# Patient Record
Sex: Female | Born: 1935 | Race: White | Hispanic: Yes | Marital: Married
Health system: Southern US, Community
[De-identification: ages and names within clinical notes are randomized; demographics above are authoritative.]

## PROBLEM LIST (undated history)

## (undated) DIAGNOSIS — G309 Alzheimer's disease, unspecified: Secondary | ICD-10-CM

## (undated) DIAGNOSIS — R569 Unspecified convulsions: Secondary | ICD-10-CM

## (undated) DIAGNOSIS — F0280 Dementia in other diseases classified elsewhere without behavioral disturbance: Secondary | ICD-10-CM

## (undated) HISTORY — PX: ABDOMINAL HYSTERECTOMY: SHX81

---

## 2004-06-01 ENCOUNTER — Ambulatory Visit: Payer: Self-pay | Admitting: Internal Medicine

## 2004-07-07 ENCOUNTER — Ambulatory Visit: Payer: Self-pay | Admitting: Internal Medicine

## 2004-07-07 ENCOUNTER — Ambulatory Visit: Payer: Self-pay | Admitting: *Deleted

## 2004-11-17 ENCOUNTER — Ambulatory Visit: Payer: Self-pay | Admitting: Internal Medicine

## 2004-12-01 ENCOUNTER — Ambulatory Visit: Payer: Self-pay | Admitting: Internal Medicine

## 2005-06-01 ENCOUNTER — Ambulatory Visit: Payer: Self-pay | Admitting: Internal Medicine

## 2005-06-15 ENCOUNTER — Ambulatory Visit: Payer: Self-pay | Admitting: Internal Medicine

## 2005-06-28 ENCOUNTER — Ambulatory Visit: Payer: Self-pay | Admitting: Internal Medicine

## 2005-07-20 ENCOUNTER — Ambulatory Visit: Payer: Self-pay | Admitting: Cardiology

## 2005-07-20 ENCOUNTER — Ambulatory Visit (HOSPITAL_COMMUNITY): Admission: RE | Admit: 2005-07-20 | Discharge: 2005-07-20 | Payer: Self-pay | Admitting: Internal Medicine

## 2005-07-20 ENCOUNTER — Encounter: Payer: Self-pay | Admitting: Cardiology

## 2005-08-24 ENCOUNTER — Ambulatory Visit (HOSPITAL_COMMUNITY): Admission: RE | Admit: 2005-08-24 | Discharge: 2005-08-24 | Payer: Self-pay | Admitting: Family Medicine

## 2005-09-14 ENCOUNTER — Ambulatory Visit: Payer: Self-pay | Admitting: Internal Medicine

## 2005-11-09 ENCOUNTER — Ambulatory Visit: Payer: Self-pay | Admitting: Internal Medicine

## 2006-07-20 ENCOUNTER — Ambulatory Visit: Payer: Self-pay | Admitting: Internal Medicine

## 2006-08-02 ENCOUNTER — Ambulatory Visit (HOSPITAL_COMMUNITY): Admission: RE | Admit: 2006-08-02 | Discharge: 2006-08-02 | Payer: Self-pay | Admitting: Internal Medicine

## 2006-08-15 ENCOUNTER — Ambulatory Visit: Payer: Self-pay | Admitting: Internal Medicine

## 2007-04-18 ENCOUNTER — Encounter (INDEPENDENT_AMBULATORY_CARE_PROVIDER_SITE_OTHER): Payer: Self-pay | Admitting: *Deleted

## 2007-06-25 ENCOUNTER — Ambulatory Visit: Payer: Self-pay | Admitting: Internal Medicine

## 2007-06-25 LAB — CONVERTED CEMR LAB
Albumin: 4 g/dL (ref 3.5–5.2)
CO2: 27 meq/L (ref 19–32)
Calcium: 9.3 mg/dL (ref 8.4–10.5)
Glucose, Bld: 140 mg/dL — ABNORMAL HIGH (ref 70–99)
Potassium: 3.8 meq/L (ref 3.5–5.3)
Sodium: 144 meq/L (ref 135–145)
Total Protein: 7.4 g/dL (ref 6.0–8.3)

## 2007-07-04 ENCOUNTER — Emergency Department (HOSPITAL_COMMUNITY): Admission: EM | Admit: 2007-07-04 | Discharge: 2007-07-04 | Payer: Self-pay | Admitting: Emergency Medicine

## 2009-05-13 ENCOUNTER — Ambulatory Visit: Payer: Self-pay | Admitting: Internal Medicine

## 2009-05-13 LAB — CONVERTED CEMR LAB
Basophils Absolute: 0 10*3/uL (ref 0.0–0.1)
Basophils Relative: 0 % (ref 0–1)
Direct LDL: 105 mg/dL — ABNORMAL HIGH
Eosinophils Relative: 1 % (ref 0–5)
HCT: 44.3 % (ref 36.0–46.0)
Helicobacter Pylori Antibody-IgG: <0.4
LDL Cholesterol: 89 mg/dL (ref 0–99)
MCHC: 32.5 g/dL (ref 30.0–36.0)
MCV: 92.9 fL (ref 78.0–100.0)
Monocytes Absolute: 0.4 10*3/uL (ref 0.1–1.0)
Monocytes Relative: 6 % (ref 3–12)
RDW: 13 % (ref 11.5–15.5)
Total CHOL/HDL Ratio: 3.9
VLDL: 43 mg/dL — ABNORMAL HIGH (ref 0–40)
WBC: 7.2 10*3/uL (ref 4.0–10.5)

## 2009-09-04 ENCOUNTER — Ambulatory Visit: Payer: Self-pay | Admitting: Family Medicine

## 2009-12-23 ENCOUNTER — Ambulatory Visit: Payer: Self-pay | Admitting: Internal Medicine

## 2010-08-22 ENCOUNTER — Encounter: Payer: Self-pay | Admitting: Internal Medicine

## 2018-03-21 ENCOUNTER — Emergency Department (HOSPITAL_COMMUNITY)
Admission: EM | Admit: 2018-03-21 | Discharge: 2018-03-21 | Disposition: A | Discharge status: Home / Self Care | Payer: Medicare Other | Attending: Emergency Medicine | Admitting: Emergency Medicine

## 2018-03-21 ENCOUNTER — Emergency Department (HOSPITAL_COMMUNITY): Payer: Medicare Other

## 2018-03-21 ENCOUNTER — Other Ambulatory Visit: Payer: Self-pay

## 2018-03-21 ENCOUNTER — Encounter (HOSPITAL_COMMUNITY): Payer: Self-pay | Admitting: Emergency Medicine

## 2018-03-21 DIAGNOSIS — F0280 Dementia in other diseases classified elsewhere without behavioral disturbance: Secondary | ICD-10-CM | POA: Diagnosis not present

## 2018-03-21 DIAGNOSIS — Z79899 Other long term (current) drug therapy: Secondary | ICD-10-CM | POA: Insufficient documentation

## 2018-03-21 DIAGNOSIS — R140 Abdominal distension (gaseous): Secondary | ICD-10-CM

## 2018-03-21 DIAGNOSIS — G308 Other Alzheimer's disease: Secondary | ICD-10-CM | POA: Diagnosis not present

## 2018-03-21 HISTORY — DX: Alzheimer's disease, unspecified: G30.9

## 2018-03-21 HISTORY — DX: Unspecified convulsions: R56.9

## 2018-03-21 HISTORY — DX: Dementia in other diseases classified elsewhere, unspecified severity, without behavioral disturbance, psychotic disturbance, mood disturbance, and anxiety: F02.80

## 2018-03-21 MED ORDER — SIMETHICONE 80 MG PO CHEW: 80.0000 mg | CHEWABLE_TABLET | Freq: Four times a day (QID) | ORAL | 0 refills | Status: AC | PRN

## 2018-03-21 NOTE — ED Triage Notes (Addendum)
Patient BIB family, reports patient has not had a BM in 10 days. Reports using enema last night with no relief. Hx dementia. Nonverbal. Family denies vomiting.

## 2018-03-21 NOTE — ED Provider Notes (Signed)
[facility]-EMERGENCY DEPT Provider Note   CSN: [id number] Arrival date & time: 03/21/18  1800     History   Chief Complaint Chief Complaint  Patient presents with  . Constipation    HPI April Lucero is a 84 y.o. female.  HPI Patient with history of dementia.  She is nonverbal at baseline.  Level 5 caveat.  Per family patient has had 10 days of constipation.  Has been given 2 doses of MiraLAX and an enema yesterday.  No vomiting.  Patient's been eating well. Past Medical History:  Diagnosis Date  . Alzheimer disease   . Seizures (HCC)     There are no active problems to display for this patient.   Past Surgical History:  Procedure Laterality Date  . ABDOMINAL HYSTERECTOMY       OB History   None      Home Medications    Prior to Admission medications   Medication Sig Start Date End Date Taking? Authorizing Provider  donepezil (ARICEPT) 5 MG tablet Take 5 mg by mouth at bedtime. 01/17/18   [provider]  memantine ([name] XR) 28 MG CP24 24 hr capsule Take 28 mg by mouth daily. 12/29/17   [provider]  QUEtiapine (SEROQUEL) 100 MG tablet Take 100 mg by mouth at bedtime. 01/08/18   [provider]  simethicone (GAS-X) 80 MG chewable tablet Chew 1 tablet (80 mg total) by mouth every 6 (six) hours as needed for flatulence. 03/21/18   Loren RacerYelverton, Mixtli Reno, MD    Family History No family history on file.  Social History Social History   Tobacco Use  . Smoking status: Not on file  Substance Use Topics  . Alcohol use: Not on file  . Drug use: Not on file     Allergies   Patient has no known allergies.   Review of Systems Review of Systems  Unable to perform ROS: Dementia     Physical Exam Updated Vital Signs BP (!) 158/99 (BP Location: Left Arm)   Pulse 95   Temp 97.6 F (36.4 C) (Axillary)   Resp 18   SpO2 95%   Physical Exam  Constitutional: She appears well-developed and well-nourished. No  distress.  HENT:  Head: Normocephalic and atraumatic.  Mouth/Throat: Oropharynx is clear and moist.  Eyes: Pupils are equal, round, and reactive to light. EOM are normal.  Neck: Normal range of motion. Neck supple.  Cardiovascular: Normal rate and regular rhythm.  Pulmonary/Chest: Effort normal and breath sounds normal.  Abdominal: Soft. Bowel sounds are normal. She exhibits distension. There is no tenderness. There is no rebound and no guarding.  Mild abdominal distention.  Musculoskeletal: Normal range of motion. She exhibits no edema or tenderness.  Neurological: She is alert.  Moving all extremities without deficit.  Skin: Skin is warm and dry. Capillary refill takes less than 2 seconds. No rash noted. She is not diaphoretic. No erythema.  Nursing note and vitals reviewed.    ED Treatments / Results  Labs (all labs ordered are listed, but only abnormal results are displayed) Labs Reviewed - No data to display  EKG None  Radiology Dg Abd Acute W/chest  Result Date: 03/21/2018 CLINICAL DATA:  Constipation, no bowel movement for 10 days EXAM: DG ABDOMEN ACUTE W/ 1V CHEST COMPARISON:  None FINDINGS: Enlargement of cardiac silhouette. Pulmonary vascularity normal. Atherosclerotic calcification aorta. Probable hiatal hernia. Lungs appear clear. Question nipple shadows versus nodular densities at the lung bases. Bones demineralized. Gaseous distention  of stomach. Bowel gas pattern otherwise normal. Scattered stool throughout colon, with normal retained stool burden. No bowel dilatation, bowel wall thickening, or free air. No urinary tract calcification. IMPRESSION: Normal bowel gas pattern. Question nipple shadows; repeat chest radiograph with nipple markers recommended to exclude pulmonary nodule. Electronically Signed   By: Ulyses SouthwardMark  Boles M.D.   On: 03/21/2018 20:47    Procedures Procedures (including critical care time)  Medications Ordered in ED Medications - No data to  display   Initial Impression / Assessment and Plan / ED Course  I have reviewed the triage vital signs and the nursing notes.  Pertinent labs & imaging results that were available during my care of the patient were reviewed by me and considered in my medical decision making (see chart for details).     Abdominal exam is benign.  Abdominal x-rays without evidence of constipation.  Gas throughout.  Discussed with family.  Will follow-up with primary physician.  Return precautions given.  Final Clinical Impressions(s) / ED Diagnoses   Final diagnoses:  Gaseous abdominal distention    ED Discharge Orders         Ordered    simethicone (GAS-X) 80 MG chewable tablet  Every 6 hours PRN     03/21/18 2130           Loren RacerYelverton, Ed Mandich, MD 03/21/18 2131

## 2019-07-10 ENCOUNTER — Emergency Department (HOSPITAL_COMMUNITY): Payer: Medicare Other

## 2019-07-10 ENCOUNTER — Inpatient Hospital Stay (HOSPITAL_COMMUNITY)
Admission: EM | Admit: 2019-07-10 | Discharge: 2019-07-17 | DRG: 177 | Disposition: A | Discharge status: Hospice | Payer: Medicare Other | Attending: Internal Medicine | Admitting: Internal Medicine

## 2019-07-10 ENCOUNTER — Other Ambulatory Visit: Payer: Self-pay

## 2019-07-10 DIAGNOSIS — R569 Unspecified convulsions: Secondary | ICD-10-CM

## 2019-07-10 DIAGNOSIS — Z66 Do not resuscitate: Secondary | ICD-10-CM | POA: Diagnosis not present

## 2019-07-10 DIAGNOSIS — R739 Hyperglycemia, unspecified: Secondary | ICD-10-CM | POA: Diagnosis not present

## 2019-07-10 DIAGNOSIS — R636 Underweight: Secondary | ICD-10-CM | POA: Diagnosis present

## 2019-07-10 DIAGNOSIS — T380X5A Adverse effect of glucocorticoids and synthetic analogues, initial encounter: Secondary | ICD-10-CM | POA: Diagnosis not present

## 2019-07-10 DIAGNOSIS — I7789 Other specified disorders of arteries and arterioles: Secondary | ICD-10-CM | POA: Diagnosis present

## 2019-07-10 DIAGNOSIS — Z90710 Acquired absence of both cervix and uterus: Secondary | ICD-10-CM

## 2019-07-10 DIAGNOSIS — G9341 Metabolic encephalopathy: Secondary | ICD-10-CM | POA: Diagnosis present

## 2019-07-10 DIAGNOSIS — E870 Hyperosmolality and hypernatremia: Secondary | ICD-10-CM | POA: Diagnosis present

## 2019-07-10 DIAGNOSIS — J9601 Acute respiratory failure with hypoxia: Secondary | ICD-10-CM | POA: Diagnosis present

## 2019-07-10 DIAGNOSIS — J1289 Other viral pneumonia: Secondary | ICD-10-CM | POA: Diagnosis present

## 2019-07-10 DIAGNOSIS — J1282 Pneumonia due to coronavirus disease 2019: Secondary | ICD-10-CM | POA: Diagnosis present

## 2019-07-10 DIAGNOSIS — Z79899 Other long term (current) drug therapy: Secondary | ICD-10-CM | POA: Diagnosis not present

## 2019-07-10 DIAGNOSIS — U071 COVID-19: Secondary | ICD-10-CM | POA: Diagnosis present

## 2019-07-10 DIAGNOSIS — E876 Hypokalemia: Secondary | ICD-10-CM | POA: Diagnosis present

## 2019-07-10 DIAGNOSIS — R221 Localized swelling, mass and lump, neck: Secondary | ICD-10-CM | POA: Diagnosis not present

## 2019-07-10 DIAGNOSIS — G309 Alzheimer's disease, unspecified: Secondary | ICD-10-CM | POA: Diagnosis present

## 2019-07-10 DIAGNOSIS — Z515 Encounter for palliative care: Secondary | ICD-10-CM | POA: Diagnosis not present

## 2019-07-10 DIAGNOSIS — G40909 Epilepsy, unspecified, not intractable, without status epilepticus: Secondary | ICD-10-CM | POA: Diagnosis present

## 2019-07-10 DIAGNOSIS — Z681 Body mass index (BMI) 19 or less, adult: Secondary | ICD-10-CM | POA: Diagnosis not present

## 2019-07-10 DIAGNOSIS — F0280 Dementia in other diseases classified elsewhere without behavioral disturbance: Secondary | ICD-10-CM | POA: Diagnosis present

## 2019-07-10 DIAGNOSIS — R0902 Hypoxemia: Secondary | ICD-10-CM

## 2019-07-10 LAB — COMPREHENSIVE METABOLIC PANEL
ALT: 14 U/L (ref 0–44)
AST: 29 U/L (ref 15–41)
Albumin: 3.1 g/dL — ABNORMAL LOW (ref 3.5–5.0)
Alkaline Phosphatase: 28 U/L — ABNORMAL LOW (ref 38–126)
Anion gap: 9 (ref 5–15)
BUN: 18 mg/dL (ref 8–23)
CO2: 31 mmol/L (ref 22–32)
Calcium: 8.9 mg/dL (ref 8.9–10.3)
Chloride: 104 mmol/L (ref 98–111)
Creatinine, Ser: 0.64 mg/dL (ref 0.44–1.00)
GFR calc Af Amer: >60 mL/min (ref >60)
GFR calc non Af Amer: >60 mL/min (ref >60)
Glucose, Bld: 89 mg/dL (ref 70–99)
Potassium: 4 mmol/L (ref 3.5–5.1)
Sodium: 144 mmol/L (ref 135–145)
Total Bilirubin: 1.3 mg/dL — ABNORMAL HIGH (ref 0.3–1.2)
Total Protein: 6.9 g/dL (ref 6.5–8.1)

## 2019-07-10 LAB — LACTIC ACID, PLASMA
Lactic Acid, Venous: 1.8 mmol/L (ref 0.5–1.9)
Lactic Acid, Venous: 1.7 mmol/L (ref 0.5–1.9)

## 2019-07-10 LAB — CBC WITH DIFFERENTIAL/PLATELET
Abs Immature Granulocytes: 0.02 10*3/uL (ref 0.00–0.07)
Basophils Absolute: 0 10*3/uL (ref 0.0–0.1)
Basophils Relative: 0 %
Eosinophils Absolute: 0 10*3/uL (ref 0.0–0.5)
Eosinophils Relative: 0 %
HCT: 40.5 % (ref 36.0–46.0)
Hemoglobin: 12.9 g/dL (ref 12.0–15.0)
Immature Granulocytes: 1 %
Lymphocytes Relative: 11 %
Lymphs Abs: 0.5 10*3/uL — ABNORMAL LOW (ref 0.7–4.0)
MCH: 31.7 pg (ref 26.0–34.0)
MCHC: 31.9 g/dL (ref 30.0–36.0)
MCV: 99.5 fL (ref 80.0–100.0)
Monocytes Absolute: 0.1 10*3/uL (ref 0.1–1.0)
Monocytes Relative: 3 %
Neutro Abs: 3.6 10*3/uL (ref 1.7–7.7)
Neutrophils Relative %: 85 %
Platelets: 110 10*3/uL — ABNORMAL LOW (ref 150–400)
RBC: 4.07 MIL/uL (ref 3.87–5.11)
RDW: 13.2 % (ref 11.5–15.5)
WBC: 4.2 10*3/uL (ref 4.0–10.5)
nRBC: 0 % (ref 0.0–0.2)

## 2019-07-10 LAB — POC SARS CORONAVIRUS 2 AG -  ED: SARS Coronavirus 2 Ag: POSITIVE — AB

## 2019-07-10 LAB — URINALYSIS, ROUTINE W REFLEX MICROSCOPIC
Bilirubin Urine: NEGATIVE
Glucose, UA: NEGATIVE mg/dL
Hgb urine dipstick: NEGATIVE
Ketones, ur: NEGATIVE mg/dL
Leukocytes,Ua: NEGATIVE
Nitrite: NEGATIVE
Protein, ur: 100 mg/dL — AB
Specific Gravity, Urine: 1.015 (ref 1.005–1.030)
pH: 6 (ref 5.0–8.0)

## 2019-07-10 LAB — PROTIME-INR
INR: 1 (ref 0.8–1.2)
Prothrombin Time: 13.5 seconds (ref 11.4–15.2)

## 2019-07-10 LAB — D-DIMER, QUANTITATIVE: D-Dimer, Quant: 3.38 ug/mL-FEU — ABNORMAL HIGH (ref 0.00–0.50)

## 2019-07-10 LAB — APTT: aPTT: 31 seconds (ref 24–36)

## 2019-07-10 LAB — ABO/RH: ABO/RH(D): O POS

## 2019-07-10 LAB — BRAIN NATRIURETIC PEPTIDE: B Natriuretic Peptide: 116 pg/mL — ABNORMAL HIGH (ref 0.0–100.0)

## 2019-07-10 LAB — FERRITIN: Ferritin: 204 ng/mL (ref 11–307)

## 2019-07-10 LAB — TSH: TSH: 0.853 u[IU]/mL (ref 0.350–4.500)

## 2019-07-10 LAB — HEPATITIS B SURFACE ANTIGEN: Hepatitis B Surface Ag: NONREACTIVE

## 2019-07-10 LAB — C-REACTIVE PROTEIN: CRP: 29 mg/dL — ABNORMAL HIGH (ref <1.0)

## 2019-07-10 LAB — TYPE AND SCREEN
ABO/RH(D): O POS
Antibody Screen: NEGATIVE

## 2019-07-10 LAB — TROPONIN I (HIGH SENSITIVITY): Troponin I (High Sensitivity): 11 ng/L (ref <18)

## 2019-07-10 MED ORDER — DONEPEZIL HCL 10 MG PO TABS
5.0000 mg | ORAL_TABLET | Freq: Every day | ORAL | Status: DC
  Administered 2019-07-11: 5 mg via ORAL
  Filled 2019-07-10 (×2): qty 1

## 2019-07-10 MED ORDER — ACETAMINOPHEN 650 MG RE SUPP
650.0000 mg | Freq: Once | RECTAL | Status: AC
  Administered 2019-07-10: 650 mg via RECTAL

## 2019-07-10 MED ORDER — QUETIAPINE FUMARATE 25 MG PO TABS
100.0000 mg | ORAL_TABLET | Freq: Every day | ORAL | Status: DC
  Administered 2019-07-11: 100 mg via ORAL
  Filled 2019-07-10: qty 1
  Filled 2019-07-10: qty 4

## 2019-07-10 MED ORDER — SENNOSIDES-DOCUSATE SODIUM 8.6-50 MG PO TABS: 1.0000 | ORAL_TABLET | Freq: Every evening | ORAL | Status: DC | PRN

## 2019-07-10 MED ORDER — ENOXAPARIN SODIUM 40 MG/0.4ML ~~LOC~~ SOLN
40.0000 mg | SUBCUTANEOUS | Status: DC
  Administered 2019-07-11: 40 mg via SUBCUTANEOUS
  Filled 2019-07-10: qty 0.4

## 2019-07-10 MED ORDER — ACETAMINOPHEN 650 MG RE SUPP
RECTAL | Status: AC
  Administered 2019-07-10: 650 mg
  Filled 2019-07-10: qty 1

## 2019-07-10 MED ORDER — SODIUM CHLORIDE 0.9 % IV SOLN
100.0000 mg | Freq: Every day | INTRAVENOUS | Status: AC
  Administered 2019-07-11 – 2019-07-14 (×4): 100 mg via INTRAVENOUS
  Filled 2019-07-10 (×4): qty 20

## 2019-07-10 MED ORDER — ZINC SULFATE 220 (50 ZN) MG PO CAPS
220.0000 mg | ORAL_CAPSULE | Freq: Every day | ORAL | Status: DC
  Filled 2019-07-10 (×4): qty 1

## 2019-07-10 MED ORDER — ACETAMINOPHEN 325 MG PO TABS
650.0000 mg | ORAL_TABLET | Freq: Four times a day (QID) | ORAL | Status: DC | PRN
  Filled 2019-07-10: qty 2

## 2019-07-10 MED ORDER — HYDROCOD POLST-CPM POLST ER 10-8 MG/5ML PO SUER: 5.0000 mL | Freq: Two times a day (BID) | ORAL | Status: DC | PRN

## 2019-07-10 MED ORDER — SODIUM CHLORIDE 0.9 % IV SOLN
200.0000 mg | Freq: Once | INTRAVENOUS | Status: AC
  Administered 2019-07-10: 200 mg via INTRAVENOUS
  Filled 2019-07-10: qty 200

## 2019-07-10 MED ORDER — VITAMIN C 500 MG PO TABS
500.0000 mg | ORAL_TABLET | Freq: Every day | ORAL | Status: DC
  Filled 2019-07-10 (×4): qty 1

## 2019-07-10 MED ORDER — IPRATROPIUM-ALBUTEROL 20-100 MCG/ACT IN AERS
1.0000 | INHALATION_SPRAY | Freq: Four times a day (QID) | RESPIRATORY_TRACT | Status: DC
  Administered 2019-07-11 – 2019-07-17 (×23): 1 via RESPIRATORY_TRACT
  Filled 2019-07-10 (×2): qty 4

## 2019-07-10 MED ORDER — MEMANTINE HCL ER 28 MG PO CP24
28.0000 mg | ORAL_CAPSULE | Freq: Every day | ORAL | Status: DC
  Filled 2019-07-10 (×8): qty 1

## 2019-07-10 MED ORDER — ONDANSETRON HCL 4 MG/2ML IJ SOLN: 4.0000 mg | Freq: Four times a day (QID) | INTRAMUSCULAR | Status: DC | PRN

## 2019-07-10 MED ORDER — LEVOFLOXACIN IN D5W 500 MG/100ML IV SOLN
500.0000 mg | Freq: Once | INTRAVENOUS | Status: AC
  Administered 2019-07-10: 16:00:00 500 mg via INTRAVENOUS
  Filled 2019-07-10: qty 100

## 2019-07-10 MED ORDER — DEXAMETHASONE SODIUM PHOSPHATE 10 MG/ML IJ SOLN
6.0000 mg | INTRAMUSCULAR | Status: DC
  Administered 2019-07-11: 6 mg via INTRAVENOUS
  Filled 2019-07-10: qty 1

## 2019-07-10 MED ORDER — GUAIFENESIN-DM 100-10 MG/5ML PO SYRP: 10.0000 mL | ORAL_SOLUTION | ORAL | Status: DC | PRN

## 2019-07-10 MED ORDER — LEVETIRACETAM 500 MG PO TABS
500.0000 mg | ORAL_TABLET | Freq: Two times a day (BID) | ORAL | Status: DC
  Administered 2019-07-11: 500 mg via ORAL
  Filled 2019-07-10 (×2): qty 1

## 2019-07-10 MED ORDER — SIMETHICONE 80 MG PO CHEW
80.0000 mg | CHEWABLE_TABLET | Freq: Four times a day (QID) | ORAL | Status: DC | PRN
  Filled 2019-07-10: qty 1

## 2019-07-10 MED ORDER — SODIUM CHLORIDE 0.9 % IV BOLUS
1000.0000 mL | Freq: Once | INTRAVENOUS | Status: AC
  Administered 2019-07-10: 1000 mL via INTRAVENOUS

## 2019-07-10 MED ORDER — ONDANSETRON HCL 4 MG PO TABS: 4.0000 mg | ORAL_TABLET | Freq: Four times a day (QID) | ORAL | Status: DC | PRN

## 2019-07-10 NOTE — ED Notes (Addendum)
ED TO INPATIENT HANDOFF REPORT  ED Nurse Name and Phone #:  Anderson Malta [phone] S Name/Age/Gender April Lucero 85 y.o. female Room/Bed: WA18/WA18  Code Status   Code Status: Full Code  Home/SNF/Other HOME Patient oriented to: self Is this baseline? Yes   Triage Complete: Triage complete  Chief Complaint possible Covid, congestion, cough, fever  Triage Note Arrives via EMS from home, C/C fever, and congestion that started last night per family. EMS temp 101.8. Rhonchi and tachypnea noted. Original room air sats at mid 23s, EMS applied a  non-rebreather and got saturation in the 90s.    Allergies No Known Allergies  Level of Care/Admitting Diagnosis ED Disposition    ED Disposition Condition [facility] Area: [place] [[id number]]  Level of Care: Med-Surg [16]  Covid Evaluation: Confirmed COVID Positive  Diagnosis: Pneumonia due to COVID-19 virus [[id number]]  Admitting Physician: Alma Friendly [[id number]]  Attending Physician: Alma Friendly [[id number]]  Estimated length of stay: past midnight tomorrow  Certification:: I certify this patient will need inpatient services for at least 2 midnights  PT Class (Do Not Modify): Inpatient [101]  PT Acc Code (Do Not Modify): Private [1]       B Medical/Surgery History Past Medical History:  Diagnosis Date  . Alzheimer disease   . Seizures ([place])    Past Surgical History:  Procedure Laterality Date  . ABDOMINAL HYSTERECTOMY       A IV Location/Drains/Wounds Patient Lines/Drains/Airways Status   Active Line/Drains/Airways    Name:   Placement date:   Placement time:   Site:   Days:   Peripheral IV 07/10/19 Left;Upper Forearm   07/10/19    1549    Forearm   less than 1          Intake/Output Last 24 hours  Intake/Output Summary (Last 24 hours) at 07/10/2019 2119 Last data filed at 07/10/2019 1948 Gross per 24 hour  Intake 1345.6 ml  Output -  Net 1345.6 ml     Labs/Imaging Results for orders placed or performed during the hospital encounter of 07/10/19 (from the past 48 hour(s))  Lactic acid, plasma     Status: None   Collection Time: 07/10/19  3:48 PM  Result Value Ref Range   Lactic Acid, Venous 1.8 0.5 - 1.9 mmol/L    Comment: Performed at [facility], [place] [address], [place], [place] [address]  Comprehensive metabolic panel     Status: Abnormal   Collection Time: 07/10/19  3:48 PM  Result Value Ref Range   Sodium 144 135 - 145 mmol/L   Potassium 4.0 3.5 - 5.1 mmol/L   Chloride 104 98 - 111 mmol/L   CO2 31 22 - 32 mmol/L   Glucose, Bld 89 70 - 99 mg/dL   BUN 18 8 - 23 mg/dL   Creatinine, Ser 0.64 0.44 - 1.00 mg/dL   Calcium 8.9 8.9 - 10.3 mg/dL   Total Protein 6.9 6.5 - 8.1 g/dL   Albumin 3.1 (L) 3.5 - 5.0 g/dL   AST 29 15 - 41 U/L   ALT 14 0 - 44 U/L   Alkaline Phosphatase 28 (L) 38 - 126 U/L   Total Bilirubin 1.3 (H) 0.3 - 1.2 mg/dL   GFR calc non Af Amer >60 >60 mL/min   GFR calc Af Amer >60 >60 mL/min   Anion gap 9 5 - 15    Comment: Performed at [facility], [address] [place], [place] [address]  CBC WITH DIFFERENTIAL     Status: Abnormal   Collection Time: 07/10/19  3:48 PM  Result Value Ref Range   WBC 4.2 4.0 - 10.5 K/uL   RBC 4.07 3.87 - 5.11 MIL/uL   Hemoglobin 12.9 12.0 - 15.0 g/dL   HCT 40.5 36.0 - 46.0 %   MCV 99.5 80.0 - 100.0 fL   MCH 31.7 26.0 - 34.0 pg   MCHC 31.9 30.0 - 36.0 g/dL   RDW 13.2 11.5 - 15.5 %   Platelets 110 (L) 150 - 400 K/uL    Comment: PLATELET COUNT CONFIRMED BY SMEAR SPECIMEN CHECKED FOR CLOTS Immature Platelet Fraction may be clinically indicated, consider ordering this additional test [id number]    nRBC 0.0 0.0 - 0.2 %   Neutrophils Relative % 85 %   Neutro Abs 3.6 1.7 - 7.7 K/uL   Lymphocytes Relative 11 %   Lymphs Abs 0.5 (L) 0.7 - 4.0 K/uL   Monocytes Relative 3 %   Monocytes Absolute 0.1 0.1 - 1.0 K/uL   Eosinophils Relative 0  %   Eosinophils Absolute 0.0 0.0 - 0.5 K/uL   Basophils Relative 0 %   Basophils Absolute 0.0 0.0 - 0.1 K/uL   Immature Granulocytes 1 %   Abs Immature Granulocytes 0.02 0.00 - 0.07 K/uL    Comment: Performed at [facility], [place] [address] [place], [place] [address]  APTT     Status: None   Collection Time: 07/10/19  3:48 PM  Result Value Ref Range   aPTT 31 24 - 36 seconds    Comment: Performed at [facility], [place] [address], [place], [place] [address]  Protime-INR     Status: None   Collection Time: 07/10/19  3:48 PM  Result Value Ref Range   Prothrombin Time 13.5 11.4 - 15.2 seconds   INR 1.0 0.8 - 1.2    Comment: (NOTE) INR goal varies based on device and disease states. Performed at [facility], [place] [address], [place], [place] [address]   Urinalysis, Routine w reflex microscopic     Status: Abnormal   Collection Time: 07/10/19  4:31 PM  Result Value Ref Range   Color, Urine YELLOW YELLOW   APPearance CLEAR CLEAR   Specific Gravity, Urine 1.015 1.005 - 1.030   pH 6.0 5.0 - 8.0   Glucose, UA NEGATIVE NEGATIVE mg/dL   Hgb urine dipstick NEGATIVE NEGATIVE   Bilirubin Urine NEGATIVE NEGATIVE   Ketones, ur NEGATIVE NEGATIVE mg/dL   Protein, ur 100 (A) NEGATIVE mg/dL   Nitrite NEGATIVE NEGATIVE   Leukocytes,Ua NEGATIVE NEGATIVE   RBC / HPF 0-5 0 - 5 RBC/hpf   WBC, UA 0-5 0 - 5 WBC/hpf   Bacteria, UA RARE (A) NONE SEEN    Comment: Performed at [facility], [place] [address], [place], [place] [address]  POC SARS Coronavirus 2 Ag-ED - Nasal Swab (BD Veritor Kit)     Status: Abnormal   Collection Time: 07/10/19  4:43 PM  Result Value Ref Range   SARS Coronavirus 2 Ag POSITIVE (A) NEGATIVE    Comment: (NOTE) SARS-CoV-2 antigen PRESENT. Positive results indicate the presence of viral antigens, but clinical correlation with patient history and other diagnostic information is necessary to determine  patient infection status.  Positive results do not rule out bacterial infection or co-infection  with other viruses. False positive results are rare but can occur, and confirmatory RT-PCR  testing may be appropriate in some circumstances. The expected result is Negative. Fact Sheet for Patients: PodPark.tn Fact Sheet for Providers: GiftContent.is  This test is not yet approved or cleared by the [place] FDA and  has been authorized for detection and/or diagnosis of SARS-CoV-2 by FDA under an Emergency Use Authorization (EUA).  This EUA will remain in effect (meaning this test can be used) for the duration of  the COVID-19 declaration under Section 564(b)(1) of the Act, 21 U.S.C. section 360bbb-3(b)(1), unless the a uthorization is terminated or revoked sooner.   Troponin I (High Sensitivity)     Status: None   Collection Time: 07/10/19  5:44 PM  Result Value Ref Range   Troponin I (High Sensitivity) 11 <18 ng/L    Comment: (NOTE) Elevated high sensitivity troponin I (hsTnI) values and significant  changes across serial measurements may suggest ACS but many other  chronic and acute conditions are known to elevate hsTnI results.  Refer to the "Links" section for chest pain algorithms and additional  guidance. Performed at [facility], [place] [address], [place], [place] [address]   Hepatitis B surface antigen     Status: None   Collection Time: 07/10/19  5:44 PM  Result Value Ref Range   Hepatitis B Surface Ag NON REACTIVE NON REACTIVE    Comment: Performed at [place] [address] [place], [place] [address]  ABO/Rh     Status: None (Preliminary result)   Collection Time: 07/10/19  5:44 PM  Result Value Ref Range   ABO/RH(D)      O POS Performed at [facility], [place] [address] [place], [place] [address]   Brain natriuretic peptide     Status: Abnormal   Collection  Time: 07/10/19  5:44 PM  Result Value Ref Range   B Natriuretic Peptide 116.0 (H) 0.0 - 100.0 pg/mL    Comment: Performed at [facility], [place] [address], [place], [place] [address]  C-reactive protein     Status: Abnormal   Collection Time: 07/10/19  5:44 PM  Result Value Ref Range   CRP 29.0 (H) <1.0 mg/dL    Comment: Performed at [facility], [place] [address] [place], [place] [address]  D-dimer, quantitative (not at [facility])     Status: Abnormal   Collection Time: 07/10/19  5:44 PM  Result Value Ref Range   D-Dimer, Quant 3.38 (H) 0.00 - 0.50 ug/mL-FEU    Comment: (NOTE) At the manufacturer cut-off of 0.50 ug/mL FEU, this assay has been documented to exclude PE with a sensitivity and negative predictive value of 97 to 99%.  At this time, this assay has not been approved by the FDA to exclude DVT/VTE. Results should be correlated with clinical presentation. Performed at [facility], [place] [address], [place], [place] [address]   Ferritin     Status: None   Collection Time: 07/10/19  5:44 PM  Result Value Ref Range   Ferritin 204 11 - 307 ng/mL    Comment: Performed at [facility], [place] [address] [place], [place] [address]  TSH     Status: None   Collection Time: 07/10/19  5:44 PM  Result Value Ref Range   TSH 0.853 0.350 - 4.500 uIU/mL    Comment: Performed by a 3rd Generation assay with a functional sensitivity of <=0.01 uIU/mL. Performed at [facility], [place] [address], [place], [place] [address]  Type and screen     Status: None   Collection Time: 07/10/19  6:10 PM  Result Value Ref Range   ABO/RH(D) O POS    Antibody Screen NEG    Sample Expiration      07/13/2019,2359 Performed at [facility], [place] [address], [place], [place] [address]   Lactic acid, plasma     Status: None   Collection Time: 07/10/19  6:15 PM  Result Value Ref Range   Lactic Acid,  Venous 1.7 0.5 - 1.9 mmol/L    Comment: Performed at [facility], [place] [address], [place], [place] [address]   Dg Chest Port 1 View  Result Date: 07/10/2019 CLINICAL DATA:  Fever and shortness of breath. Rhonchi. Tachypnea. Chest congestion. EXAM: PORTABLE CHEST 1 VIEW COMPARISON:  Radiographs dated 03/21/2018 FINDINGS: There are extensive bilateral pulmonary infiltrates, worse on the left than the right. Heart size and pulmonary vascularity are normal. No effusions. Aortic atherosclerosis. Slight compression fracture of T12, likely old. IMPRESSION: 1. Extensive bilateral pulmonary infiltrates, worse on the left than the right. 2.  Aortic Atherosclerosis (ICD10-I70.0). 3. Slight compression fracture of T12, likely old. Electronically Signed   By: Lorriane Shire M.D.   On: 07/10/2019 16:04    Pending Labs Unresulted Labs (From admission, onward)    Start     Ordered   07/11/19 0500  CBC with Differential/Platelet  Daily,   R     07/10/19 1731   07/11/19 0500  Comprehensive metabolic panel  Daily,   R     07/10/19 1731   07/11/19 0500  C-reactive protein  Daily,   R     07/10/19 1731   07/11/19 0500  D-dimer, quantitative (not at [facility])  Daily,   R     07/10/19 1731   07/11/19 0500  Ferritin  Daily,   R     07/10/19 1731   07/10/19 1735  T4, free  Once,   STAT     07/10/19 1735   07/10/19 1526  Blood Culture (routine x 2)  BLOOD CULTURE X 2,   STAT     07/10/19 1525   07/10/19 1526  Urine culture  ONCE - STAT,   STAT     07/10/19 1525          Vitals/Pain Today's Vitals   07/10/19 1930 07/10/19 2000 07/10/19 2030 07/10/19 2100  BP: (!) 111/58 (!) 106/55 (!) 100/53 (!) 109/53  Pulse: 69 64 64 62  Resp: '15 17 15 13  ' Temp:      TempSrc:      SpO2: 94% 95% 92% 94%  Weight:        Isolation Precautions Airborne and Contact precautions  Medications Medications  enoxaparin (LOVENOX) injection 40 mg (has no administration in time range)   Ipratropium-Albuterol (COMBIVENT) respimat 1 puff (has no administration in time range)  dexamethasone (DECADRON) injection 6 mg (has no administration in time range)  guaiFENesin-dextromethorphan (ROBITUSSIN DM) 100-10 MG/5ML syrup 10 mL (has no administration in time range)  chlorpheniramine-HYDROcodone (TUSSIONEX) 10-8 MG/5ML suspension 5 mL (has no administration in time range)  vitamin C (ASCORBIC ACID) tablet 500 mg (has no administration in time range)  zinc sulfate capsule 220 mg (has no administration in time range)  acetaminophen (TYLENOL) tablet 650 mg (has no administration in time range)  senna-docusate (Senokot-S) tablet 1 tablet (has no administration in time range)  ondansetron (ZOFRAN) tablet 4 mg (has no administration in time range)  Or  ondansetron (ZOFRAN) injection 4 mg (has no administration in time range)  donepezil (ARICEPT) tablet 5 mg (has no administration in time range)  levETIRAcetam (KEPPRA) tablet 500 mg (has no administration in time range)  memantine ([name] XR) 24 hr capsule 28 mg (has no administration in time range)  simethicone (MYLICON) chewable tablet 80 mg (has no administration in time range)  QUEtiapine (SEROQUEL) tablet 100 mg (has no administration in time range)  remdesivir 200 mg in sodium chloride 0.9% 250 mL IVPB (0 mg Intravenous Stopped 07/10/19 1948)    Followed by  remdesivir 100 mg in sodium chloride 0.9 % 100 mL IVPB (has no administration in time range)  acetaminophen (TYLENOL) 650 MG suppository (650 mg  Given 07/10/19 1553)  sodium chloride 0.9 % bolus 1,000 mL (0 mLs Intravenous Stopped 07/10/19 1734)  levofloxacin (LEVAQUIN) IVPB 500 mg (0 mg Intravenous Stopped 07/10/19 1734)  acetaminophen (TYLENOL) suppository 650 mg (650 mg Rectal Given 07/10/19 1628)    Mobility non-ambulatory High fall risk     R Recommendations: See Admitting Provider Note  Report given to: Charge RN, Danae Chen, 1W GV

## 2019-07-10 NOTE — ED Notes (Signed)
[place] (daughter)

## 2019-07-10 NOTE — ED Notes (Signed)
Attempted to sick for second set of cultures, unsuccessful and did not want to delay the start of abx.

## 2019-07-10 NOTE — ED Notes (Signed)
Turned pt on Left side to promote comfort.

## 2019-07-10 NOTE — Progress Notes (Signed)
Patient arrived via EMS. Vitals obtained and placed on monitors as ordered.

## 2019-07-10 NOTE — ED Provider Notes (Signed)
[facility]-EMERGENCY DEPT Provider Note   CSN: [id number] Arrival date & time: 07/10/19  1504     History   Chief Complaint Chief Complaint  Patient presents with  . low oxygen  . Fever    HPI April Lucero is a 85 y.o. female.     Patient came to the emergency department for fever cough.  Patient's history of severe dementia and her normal mental status he is only alert to person  The history is provided by medical records and the EMS personnel.  Fever Max temp prior to arrival:  102 Temp source:  Oral Severity:  Moderate Onset quality:  Sudden Timing:  Constant Progression:  Worsening Chronicity:  New Relieved by:  Nothing Worsened by:  Nothing   Past Medical History:  Diagnosis Date  . Alzheimer disease   . Seizures [facility](HCC)     Patient Active Problem List   Diagnosis Date Noted  . Pneumonia due to COVID-19 virus 07/10/2019  . Alzheimer disease (HCC)   . Seizures (HCC)     Past Surgical History:  Procedure Laterality Date  . ABDOMINAL HYSTERECTOMY       OB History   No obstetric history on file.      Home Medications    Prior to Admission medications   Medication Sig Start Date End Date Taking? Authorizing Provider  donepezil (ARICEPT) 5 MG tablet Take 5 mg by mouth at bedtime. 01/17/18  Yes [provider]  levETIRAcetam (KEPPRA) 500 MG tablet Take 500 mg by mouth 2 (two) times daily. 05/09/19  Yes [provider]  memantine ([name] XR) 28 MG CP24 24 hr capsule Take 28 mg by mouth at bedtime.  12/29/17  Yes [provider]  QUEtiapine (SEROQUEL) 100 MG tablet Take 100 mg by mouth at bedtime. 01/08/18  Yes [provider]  simethicone (GAS-X) 80 MG chewable tablet Chew 1 tablet (80 mg total) by mouth every 6 (six) hours as needed for flatulence. 03/21/18  Yes Loren RacerYelverton, David, MD    Family History No family history on file.  Social History Social History   Tobacco Use  . Smoking  status: Not on file  Substance Use Topics  . Alcohol use: Not on file  . Drug use: Not on file     Allergies   Patient has no known allergies.   Review of Systems Review of Systems  Unable to perform ROS: Dementia  Constitutional: Positive for fever.     Physical Exam Updated Vital Signs BP 120/62   Pulse 73   Temp (!) 102.1 F (38.9 C) (Rectal)   Resp 15   Wt 43.1 kg   SpO2 99%   Physical Exam Vitals signs and nursing note reviewed.  Constitutional:      Appearance: She is well-developed.     Comments: Lethargic  HENT:     Head: Normocephalic.     Nose: Nose normal.  Eyes:     General: No scleral icterus.    Conjunctiva/sclera: Conjunctivae normal.  Neck:     Musculoskeletal: Neck supple.     Thyroid: No thyromegaly.  Cardiovascular:     Rate and Rhythm: Normal rate and regular rhythm.     Heart sounds: No murmur. No friction rub. No gallop.   Pulmonary:     Breath sounds: No stridor. No wheezing or rales.  Chest:     Chest wall: No tenderness.  Abdominal:     General: There is no distension.  Tenderness: There is no abdominal tenderness. There is no rebound.  Musculoskeletal: Normal range of motion.  Lymphadenopathy:     Cervical: No cervical adenopathy.  Skin:    Findings: No erythema or rash.  Neurological:     Motor: No abnormal muscle tone.     Coordination: Coordination normal.     Comments: Responding to painful stimuli only      ED Treatments / Results  Labs (all labs ordered are listed, but only abnormal results are displayed) Labs Reviewed  COMPREHENSIVE METABOLIC PANEL - Abnormal; Notable for the following components:      Result Value   Albumin 3.1 (*)    Alkaline Phosphatase 28 (*)    Total Bilirubin 1.3 (*)    All other components within normal limits  CBC WITH DIFFERENTIAL/PLATELET - Abnormal; Notable for the following components:   Platelets 110 (*)    Lymphs Abs 0.5 (*)    All other components within normal limits   URINALYSIS, ROUTINE W REFLEX MICROSCOPIC - Abnormal; Notable for the following components:   Protein, ur 100 (*)    Bacteria, UA RARE (*)    All other components within normal limits  POC SARS CORONAVIRUS 2 AG -  ED - Abnormal; Notable for the following components:   SARS Coronavirus 2 Ag POSITIVE (*)    All other components within normal limits  CULTURE, BLOOD (ROUTINE X 2)  CULTURE, BLOOD (ROUTINE X 2)  URINE CULTURE  CULTURE, BLOOD (ROUTINE X 2)  CULTURE, BLOOD (ROUTINE X 2)  LACTIC ACID, PLASMA  APTT  PROTIME-INR  LACTIC ACID, PLASMA  BRAIN NATRIURETIC PEPTIDE  C-REACTIVE PROTEIN  FERRITIN  FIBRINOGEN  LACTATE DEHYDROGENASE  PROCALCITONIN  HEPATITIS B SURFACE ANTIGEN  BRAIN NATRIURETIC PEPTIDE  C-REACTIVE PROTEIN  D-DIMER, QUANTITATIVE (NOT AT [facility])  FERRITIN  CBC WITH DIFFERENTIAL/PLATELET  COMPREHENSIVE METABOLIC PANEL  C-REACTIVE PROTEIN  D-DIMER, QUANTITATIVE (NOT AT [facility])  FERRITIN  TYPE AND SCREEN  ABO/RH  TROPONIN I (HIGH SENSITIVITY)    EKG None  Radiology Dg Chest Port 1 View  Result Date: 07/10/2019 CLINICAL DATA:  Fever and shortness of breath. Rhonchi. Tachypnea. Chest congestion. EXAM: PORTABLE CHEST 1 VIEW COMPARISON:  Radiographs dated 03/21/2018 FINDINGS: There are extensive bilateral pulmonary infiltrates, worse on the left than the right. Heart size and pulmonary vascularity are normal. No effusions. Aortic atherosclerosis. Slight compression fracture of T12, likely old. IMPRESSION: 1. Extensive bilateral pulmonary infiltrates, worse on the left than the right. 2.  Aortic Atherosclerosis (ICD10-I70.0). 3. Slight compression fracture of T12, likely old. Electronically Signed   By: Lorriane Shire M.D.   On: 07/10/2019 16:04    Procedures Procedures (including critical care time)  Medications Ordered in ED Medications  enoxaparin (LOVENOX) injection 40 mg (has no administration in time range)  Ipratropium-Albuterol (COMBIVENT) respimat 1 puff  (has no administration in time range)  dexamethasone (DECADRON) injection 6 mg (has no administration in time range)  guaiFENesin-dextromethorphan (ROBITUSSIN DM) 100-10 MG/5ML syrup 10 mL (has no administration in time range)  chlorpheniramine-HYDROcodone (TUSSIONEX) 10-8 MG/5ML suspension 5 mL (has no administration in time range)  vitamin C (ASCORBIC ACID) tablet 500 mg (has no administration in time range)  zinc sulfate capsule 220 mg (has no administration in time range)  acetaminophen (TYLENOL) tablet 650 mg (has no administration in time range)  senna-docusate (Senokot-S) tablet 1 tablet (has no administration in time range)  ondansetron (ZOFRAN) tablet 4 mg (has no administration in time range)    Or  ondansetron (ZOFRAN)  injection 4 mg (has no administration in time range)  acetaminophen (TYLENOL) 650 MG suppository (650 mg  Given 07/10/19 1553)  sodium chloride 0.9 % bolus 1,000 mL (1,000 mLs Intravenous New Bag/Given 07/10/19 1554)  levofloxacin (LEVAQUIN) IVPB 500 mg (500 mg Intravenous New Bag/Given 07/10/19 1627)  acetaminophen (TYLENOL) suppository 650 mg (650 mg Rectal Given 07/10/19 1628)     Initial Impression / Assessment and Plan / ED Course  I have reviewed the triage vital signs and the nursing notes.  Pertinent labs & imaging results that were available during my care of the patient were reviewed by me and considered in my medical decision making (see chart for details).       CRITICAL CARE Performed by: Bethann Berkshire Total critical care time: 40 minutes Critical care time was exclusive of separately billable procedures and treating other patients. Critical care was necessary to treat or prevent imminent or life-threatening deterioration. Critical care was time spent personally by me on the following activities: development of treatment plan with patient and/or surrogate as well as nursing, discussions with consultants, evaluation of patient's response to treatment,  examination of patient, obtaining history from patient or surrogate, ordering and performing treatments and interventions, ordering and review of laboratory studies, ordering and review of radiographic studies, pulse oximetry and re-evaluation of patient's condition.  April Lucero was evaluated in Emergency Department on 07/10/2019 for the symptoms described in the history of present illness. She was evaluated in the context of the global COVID-19 pandemic, which necessitated consideration that the patient might be at risk for infection with the SARS-CoV-2 virus that causes COVID-19. Institutional protocols and algorithms that pertain to the evaluation of patients at risk for COVID-19 are in a state of rapid change based on information released by regulatory bodies including the CDC and federal and state organizations. These policies and algorithms were followed during the patient's care in the ED. Patient with Covid infection.  Patient will be admitted to medicine Final Clinical Impressions(s) / ED Diagnoses   Final diagnoses:  None    ED Discharge Orders    None       Bethann Berkshire, MD 07/10/19 1735

## 2019-07-10 NOTE — H&P (Signed)
History and Physical  ANNALYN BLECHER XHB:[id number] DOB: 1932/11/26 DOA: 07/10/2019  PCP: Arman Bogus., MD Patient coming from: Home  Chief Complaint: Fever/cough  HPI: April Lucero is a 85 y.o. female with medical history significant for Alzheimer's dementia, seizure disorder, presents to the ED due to fever, cough, worsening shortness of breath for the past 2 days.  History limited by Alzheimer's dementia, so history gotten from daughter over the phone.  Patient's daughter reported patient has been feeling unwell with fever, shortness of breath and dry cough for the past 2 days.  Reports patient's grandchild as well as husband also have similar symptoms, but not as severe.  Does not know any known Covid positive exposure.  Patient also has a caregiver who currently does not have any symptoms.  Patient lives with her daughter.  Patient's daughter reports patient's Alzheimer's is pretty severe, and not able to function independently.  Patient's daughter called EMS, noted temp of 101.8, initially saturated mid 70s on room air and a nonrebreather was applied and sats went up to the 90s.  ED Course: In the ED, patient's temp was 102.1, saturating around 92% on 6 L of oxygen, CRP 29, lactic acid WNL, D-dimer 3.38, chest x-ray showed extensive bilateral pulmonary infiltrates.  Patient admitted for further management  Review of Systems: Review of systems are otherwise negative   Past Medical History:  Diagnosis Date  . Alzheimer disease   . Seizures ([place])    Past Surgical History:  Procedure Laterality Date  . ABDOMINAL HYSTERECTOMY      Social History:  has no history on file for tobacco, alcohol, and drug.   No Known Allergies  No family history on file.    Prior to Admission medications   Medication Sig Start Date End Date Taking? Authorizing Provider  donepezil (ARICEPT) 5 MG tablet Take 5 mg by mouth at bedtime. 01/17/18  Yes [provider]  levETIRAcetam  (KEPPRA) 500 MG tablet Take 500 mg by mouth 2 (two) times daily. 05/09/19  Yes [provider]  memantine ([name] XR) 28 MG CP24 24 hr capsule Take 28 mg by mouth at bedtime.  12/29/17  Yes [provider]  QUEtiapine (SEROQUEL) 100 MG tablet Take 100 mg by mouth at bedtime. 01/08/18  Yes [provider]  simethicone (GAS-X) 80 MG chewable tablet Chew 1 tablet (80 mg total) by mouth every 6 (six) hours as needed for flatulence. 03/21/18  Yes Julianne Rice, MD    Physical Exam: BP 122/72 (BP Location: Right Arm)   Pulse 62   Temp 98.4 F (36.9 C) (Axillary)   Resp 16   Wt 43.1 kg   SpO2 94%   General: NAD, not oriented, currently nonverbal, thin appearing elderly Eyes: Normal ENT: Normal Neck: Supple Cardiovascular: S1, S2 present Respiratory: Bilateral rhonchi noted Abdomen: Soft, nontender, nondistended, bowel sounds present Skin: Normal Musculoskeletal: No pedal edema bilaterally Psychiatric: Unable to assess Neurologic: Unable to follow commands          Labs on Admission:  Basic Metabolic Panel: Recent Labs  Lab 07/10/19 1548  NA 144  K 4.0  CL 104  CO2 31  GLUCOSE 89  BUN 18  CREATININE 0.64  CALCIUM 8.9   Liver Function Tests: Recent Labs  Lab 07/10/19 1548  AST 29  ALT 14  ALKPHOS 28*  BILITOT 1.3*  PROT 6.9  ALBUMIN 3.1*   No results for input(s): LIPASE, AMYLASE in the last 168 hours. No results for input(s):  AMMONIA in the last 168 hours. CBC: Recent Labs  Lab 07/10/19 1548  WBC 4.2  NEUTROABS 3.6  HGB 12.9  HCT 40.5  MCV 99.5  PLT 110*   Cardiac Enzymes: No results for input(s): CKTOTAL, CKMB, CKMBINDEX, TROPONINI in the last 168 hours.  BNP (last 3 results) Recent Labs    07/10/19 1744  BNP 116.0*    ProBNP (last 3 results) No results for input(s): PROBNP in the last 8760 hours.  CBG: No results for input(s): GLUCAP in the last 168 hours.  Radiological Exams on Admission: Dg Chest Port 1 View   Result Date: 07/10/2019 CLINICAL DATA:  Fever and shortness of breath. Rhonchi. Tachypnea. Chest congestion. EXAM: PORTABLE CHEST 1 VIEW COMPARISON:  Radiographs dated 03/21/2018 FINDINGS: There are extensive bilateral pulmonary infiltrates, worse on the left than the right. Heart size and pulmonary vascularity are normal. No effusions. Aortic atherosclerosis. Slight compression fracture of T12, likely old. IMPRESSION: 1. Extensive bilateral pulmonary infiltrates, worse on the left than the right. 2.  Aortic Atherosclerosis (ICD10-I70.0). 3. Slight compression fracture of T12, likely old. Electronically Signed   By: Francene Boyers M.D.   On: 07/10/2019 16:04    EKG: Independently reviewed.  No acute ST changes  Assessment/Plan Present on Admission: . Pneumonia due to COVID-19 virus . Alzheimer disease (HCC)  Principal Problem:   Pneumonia due to COVID-19 virus Active Problems:   Alzheimer disease (HCC)   Seizures (HCC)  Pneumonia due to COVID-19 virus Fever, hypoxia on presentation Currently requiring supplemental oxygen about 3 to 4 L Inflammatory markers elevated Procalcitonin pending BC x2 pending Chest x-ray with multifocal pneumonia In the ED patient was given 1 dose of Levaquin, will hold pending procalcitonin Start remdesivir, Decadron Cough suppressants, inhaler, O2 supplementation Monitor closely  Seizure disorder Continue home Keppra  Alzheimer's dementia Continue home Aricept, [name], quetiapine    DVT prophylaxis: Lovenox  Code Status: Full code, discussed extensively with daughter Tomasa Blase, who stated wants everything done for her mother, will have further discussion with her other sibling to determine goals of care  Family Communication: Discussed with daughter as above  Disposition Plan: To be determined  Consults called: None  Admission status: Inpatient    Briant Cedar MD [facility]  If 7PM-7AM, please contact  night-coverage [email or web address]  07/10/2019, 10:20 PM

## 2019-07-10 NOTE — ED Triage Notes (Signed)
Arrives via EMS from home, C/C fever, and congestion that started last night per family. EMS temp 101.8. Rhonchi and tachypnea noted. Original room air sats at mid 55s, EMS applied a  non-rebreather and got saturation in the 90s.

## 2019-07-10 NOTE — ED Notes (Signed)
Called [facility] to give report, Mateo Flow in a patient room and will call back for report.

## 2019-07-11 ENCOUNTER — Encounter (HOSPITAL_COMMUNITY): Payer: Self-pay | Admitting: Internal Medicine

## 2019-07-11 DIAGNOSIS — J9601 Acute respiratory failure with hypoxia: Secondary | ICD-10-CM

## 2019-07-11 LAB — COMPREHENSIVE METABOLIC PANEL
ALT: 12 U/L (ref 0–44)
AST: 31 U/L (ref 15–41)
Albumin: 2.5 g/dL — ABNORMAL LOW (ref 3.5–5.0)
Alkaline Phosphatase: 31 U/L — ABNORMAL LOW (ref 38–126)
Anion gap: 12 (ref 5–15)
BUN: 20 mg/dL (ref 8–23)
CO2: 26 mmol/L (ref 22–32)
Calcium: 8.5 mg/dL — ABNORMAL LOW (ref 8.9–10.3)
Chloride: 107 mmol/L (ref 98–111)
Creatinine, Ser: 0.59 mg/dL (ref 0.44–1.00)
GFR calc Af Amer: >60 mL/min (ref >60)
GFR calc non Af Amer: >60 mL/min (ref >60)
Glucose, Bld: 107 mg/dL — ABNORMAL HIGH (ref 70–99)
Potassium: 4.6 mmol/L (ref 3.5–5.1)
Sodium: 145 mmol/L (ref 135–145)
Total Bilirubin: 1 mg/dL (ref 0.3–1.2)
Total Protein: 5.9 g/dL — ABNORMAL LOW (ref 6.5–8.1)

## 2019-07-11 LAB — SAMPLE TO BLOOD BANK

## 2019-07-11 LAB — URINE CULTURE: Culture: NO GROWTH

## 2019-07-11 LAB — D-DIMER, QUANTITATIVE: D-Dimer, Quant: 2.65 ug/mL-FEU — ABNORMAL HIGH (ref 0.00–0.50)

## 2019-07-11 LAB — PROCALCITONIN: Procalcitonin: 5.74 ng/mL

## 2019-07-11 LAB — FERRITIN: Ferritin: 309 ng/mL — ABNORMAL HIGH (ref 11–307)

## 2019-07-11 LAB — TROPONIN I (HIGH SENSITIVITY): Troponin I (High Sensitivity): 6 ng/L (ref <18)

## 2019-07-11 LAB — C-REACTIVE PROTEIN: CRP: 31 mg/dL — ABNORMAL HIGH (ref <1.0)

## 2019-07-11 MED ORDER — SODIUM CHLORIDE 0.45 % IV SOLN
INTRAVENOUS | Status: DC
  Administered 2019-07-11 – 2019-07-12 (×3): via INTRAVENOUS

## 2019-07-11 MED ORDER — FAMOTIDINE IN NACL 20-0.9 MG/50ML-% IV SOLN
20.0000 mg | INTRAVENOUS | Status: DC
  Administered 2019-07-11 – 2019-07-12 (×2): 20 mg via INTRAVENOUS
  Filled 2019-07-11 (×2): qty 50

## 2019-07-11 MED ORDER — LEVETIRACETAM IN NACL 500 MG/100ML IV SOLN
500.0000 mg | Freq: Two times a day (BID) | INTRAVENOUS | Status: DC
  Administered 2019-07-11 – 2019-07-12 (×4): 500 mg via INTRAVENOUS
  Filled 2019-07-11 (×5): qty 100

## 2019-07-11 MED ORDER — ENOXAPARIN SODIUM 30 MG/0.3ML ~~LOC~~ SOLN
30.0000 mg | SUBCUTANEOUS | Status: DC
  Administered 2019-07-11 – 2019-07-16 (×6): 30 mg via SUBCUTANEOUS
  Filled 2019-07-11 (×7): qty 0.3

## 2019-07-11 MED ORDER — SODIUM CHLORIDE 0.9 % IV SOLN
500.0000 mg | INTRAVENOUS | Status: AC
  Administered 2019-07-11 – 2019-07-15 (×5): 500 mg via INTRAVENOUS
  Filled 2019-07-11 (×6): qty 500

## 2019-07-11 MED ORDER — HYDRALAZINE HCL 25 MG PO TABS
25.0000 mg | ORAL_TABLET | Freq: Four times a day (QID) | ORAL | Status: DC | PRN
  Filled 2019-07-11: qty 0.5

## 2019-07-11 MED ORDER — SODIUM CHLORIDE 0.9 % IV SOLN
1.0000 g | INTRAVENOUS | Status: AC
  Administered 2019-07-11 – 2019-07-15 (×5): 1 g via INTRAVENOUS
  Filled 2019-07-11 (×5): qty 10

## 2019-07-11 MED ORDER — DEXAMETHASONE SODIUM PHOSPHATE 10 MG/ML IJ SOLN
6.0000 mg | Freq: Two times a day (BID) | INTRAMUSCULAR | Status: DC
  Administered 2019-07-11 – 2019-07-15 (×8): 6 mg via INTRAVENOUS
  Filled 2019-07-11 (×8): qty 1

## 2019-07-11 NOTE — Progress Notes (Signed)
PROGRESS NOTE  April Lucero GGY:[id number] DOB: November 07, 1932 DOA: 07/10/2019  PCP: Arman Bogus., MD  Brief History/Interval Summary: 85 y.o. female with medical history significant for Alzheimer's dementia, seizure disorder, presented to the ED due to fever, cough, worsening shortness of breath for 2 days.  Patient noted to be positive for COVID-19.  Noted to be hypoxic.  Hospitalized for further management.  Reason for Visit: Pneumonia due to COVID-19.  Acute respiratory failure with hypoxia.  Consultants: None  Procedures: None  Antibiotics: Anti-infectives (From admission, onward)   Start     Dose/Rate Route Frequency Ordered Stop   07/11/19 1000  remdesivir 100 mg in sodium chloride 0.9 % 100 mL IVPB     100 mg 200 mL/hr over 30 Minutes Intravenous Daily 07/10/19 1747 07/15/19 0959   07/10/19 1800  remdesivir 200 mg in sodium chloride 0.9% 250 mL IVPB     200 mg 580 mL/hr over 30 Minutes Intravenous Once 07/10/19 1747 07/10/19 1948   07/10/19 1545  levofloxacin (LEVAQUIN) IVPB 500 mg     500 mg 100 mL/hr over 60 Minutes Intravenous  Once 07/10/19 1541 07/10/19 1734      Subjective/Interval History: Patient noted to be somnolent.  She does have advanced dementia.    Assessment/Plan:  Acute Hypoxic Resp. Failure/Pneumonia due to COVID-19  COVID-19 Labs  Recent Labs    07/10/19 1744 07/11/19 0731  DDIMER 3.38* 2.65*  FERRITIN 204 309*  CRP 29.0* 31.0*    Positive COVID-19 results available in epic from 07/10/2019.  Objective findings: Fever: Afebrile Oxygen requirements: High flow nasal cannula.  7 to 8 L.  Saturating in the mid to late 90s.  COVID 19 Therapeutics: Antibacterials: Given Levaquin in the emergency department on 12/9.  Not continued. Start Ceftriaxone and Azithromycin today. Remdesivir: Day 2 today Steroids: Dexamethasone 6 mg daily Diuretics: Not on diuretics on a scheduled basis Actemra: Not given yet Convalescent Plasma: Not given  yet Vitamin C and Zinc: Continue PUD Prophylaxis: Initiate Pepcid DVT Prophylaxis:  Lovenox   Patient seems to be stable from a respiratory standpoint.  She is requiring high flow nasal cannula.  Saturations were noted to be in the late 90s.  Oxygen was reduced.  Patient noted to have elevated D-dimer 2.65 which is most likely due to COVID-19.  Ferritin 309.  CRP noted to be 31.  Chest x-ray from 12/9 did show significant bilateral opacities.  Procalcitonin level 5.74.  Will initiate antibacterials as well.  No Actemra due to elevated procalcitonin.  Will discuss with daughter regarding convalescent plasma.  Continue to trend inflammatory markers and D-dimer.  TSH 0.853.  If she is able then we should encourage her to do incentive spirometry.  Mobilize her as much as possible.  History of Alzheimer's dementia Appears to be quite advanced.  Patient noted to be on Aricept [name] and Seroquel.  Hold Seroquel since she appears to be somnolent this morning.  History of seizure disorder Continue with Keppra.  Goals of care Discussed with patient's daughter.  Explained to her that considering her significant comorbidities including advanced dementia patient would not do well if she requires aggressive care including mechanical ventilation and CPR.  She had multiple questions which I answered.  She agrees to DNR status.  DVT Prophylaxis: Lovenox Code Status: DNR Family Communication: Discussed with daughter Disposition Plan: Management as outlined above.    Medications:  Scheduled: . dexamethasone (DECADRON) injection  6 mg Intravenous Q24H  . donepezil  5 mg  Oral QHS  . enoxaparin (LOVENOX) injection  30 mg Subcutaneous Q24H  . Ipratropium-Albuterol  1 puff Inhalation Q6H  . levETIRAcetam  500 mg Oral BID  . memantine  28 mg Oral QHS  . QUEtiapine  100 mg Oral QHS  . vitamin C  500 mg Oral Daily  . zinc sulfate  220 mg Oral Daily   Continuous: . remdesivir 100 mg in NS 100 mL      ZSW:[id number], chlorpheniramine-HYDROcodone, guaiFENesin-dextromethorphan, ondansetron **OR** ondansetron (ZOFRAN) IV, senna-docusate, simethicone   Objective:  Vital Signs  Vitals:   07/11/19 0300 07/11/19 0416 07/11/19 0443 07/11/19 0514  BP: 137/78 (!) 145/67    Pulse: 73 75 75   Resp: 20 14    Temp: 98.6 F (37 C) 98.4 F (36.9 C)    TempSrc: Axillary Axillary Axillary   SpO2: 100% 100%    Weight:   43 kg   Height:    5' (1.524 m)    Intake/Output Summary (Last 24 hours) at 07/11/2019 1040 Last data filed at 07/10/2019 1948 Gross per 24 hour  Intake 1345.6 ml  Output -  Net 1345.6 ml   Filed Weights   07/10/19 1704 07/11/19 0443  Weight: 43.1 kg 43 kg    General appearance: Somnolent.  Disoriented. Resp: Mildly tachypneic at rest.  Coarse breath sounds bilaterally with crackles at the bases.  No wheezing or rhonchi. Cardio: S1-S2 is normal regular.  No S3-S4.  No rubs murmurs or bruit GI: Abdomen is soft.  Nontender nondistended.  Bowel sounds are present normal.  No masses organomegaly Extremities: No edema.   Neurologic: Somnolent.  Not following any commands.     Lab Results:  Data Reviewed: I have personally reviewed following labs and imaging studies  CBC: Recent Labs  Lab 07/10/19 1548  WBC 4.2  NEUTROABS 3.6  HGB 12.9  HCT 40.5  MCV 99.5  PLT 110*    Basic Metabolic Panel: Recent Labs  Lab 07/10/19 1548 07/11/19 0800  NA 144 145  K 4.0 4.6  CL 104 107  CO2 31 26  GLUCOSE 89 107*  BUN 18 20  CREATININE 0.64 0.59  CALCIUM 8.9 8.5*    GFR: Estimated Creatinine Clearance: 35.5 mL/min (by C-G formula based on SCr of 0.59 mg/dL).  Liver Function Tests: Recent Labs  Lab 07/10/19 1548 07/11/19 0800  AST 29 31  ALT 14 12  ALKPHOS 28* 31*  BILITOT 1.3* 1.0  PROT 6.9 5.9*  ALBUMIN 3.1* 2.5*     Coagulation Profile: Recent Labs  Lab 07/10/19 1548  INR 1.0     Thyroid Function Tests: Recent Labs    07/10/19 1744   TSH 0.853    Anemia Panel: Recent Labs    07/10/19 1744 07/11/19 0731  FERRITIN 204 309*    Recent Results (from the past 240 hour(s))  Blood Culture (routine x 2)     Status: None (Preliminary result)   Collection Time: 07/10/19  3:48 PM   Specimen: BLOOD LEFT FOREARM  Result Value Ref Range Status   Specimen Description   Final    BLOOD LEFT FOREARM Performed at [facility], [address] [place], [place] [address]    Special Requests   Final    BOTTLES DRAWN AEROBIC ONLY Blood Culture results may not be optimal due to an inadequate volume of blood received in culture bottles Performed at [facility], [address], [place], [place] [address]    Culture  Final    NO GROWTH < 24 HOURS Performed at [facility], [address] [place], [place] [id number]    Report Status PENDING  Incomplete      Radiology Studies: DG Chest Port 1 View  Result Date: 07/10/2019 CLINICAL DATA:  Fever and shortness of breath. Rhonchi. Tachypnea. Chest congestion. EXAM: PORTABLE CHEST 1 VIEW COMPARISON:  Radiographs dated 03/21/2018 FINDINGS: There are extensive bilateral pulmonary infiltrates, worse on the left than the right. Heart size and pulmonary vascularity are normal. No effusions. Aortic atherosclerosis. Slight compression fracture of T12, likely old. IMPRESSION: 1. Extensive bilateral pulmonary infiltrates, worse on the left than the right. 2.  Aortic Atherosclerosis (ICD10-I70.0). 3. Slight compression fracture of T12, likely old. Electronically Signed   By: Francene BoyersJames  Maxwell M.D.   On: 07/10/2019 16:04       LOS: 1 day   Osvaldo ShipperGokul Kevis Qu  [facility] Pager on [email or web address]  07/11/2019, 10:40 AM

## 2019-07-11 NOTE — Progress Notes (Signed)
Clinical/Bedside Swallow Evaluation Patient Details  Name: April Lucero MRN: [id number] Date of Birth: 09-25-1932  Today's Date: 07/11/2019 Time: SLP Start Time (ACUTE ONLY): 7673 SLP Stop Time (ACUTE ONLY): 1407 SLP Time Calculation (min) (ACUTE ONLY): 14 min  Past Medical History:  Past Medical History:  Diagnosis Date  . Alzheimer disease ([place])   . Seizures ([place])    Past Surgical History:  Past Surgical History:  Procedure Laterality Date  . ABDOMINAL HYSTERECTOMY     HPI:  85 y.o.femalewith medical history significant forAlzheimer's dementia, seizure disorder, presented to the ED due to fever,cough,worsening shortness of breath for 2days.  Patient noted to be positive for COVID-19.  Noted to be hypoxic.  Hospitalized for further management.   Assessment / Plan / Recommendation Clinical Impression  Pt is cachectic with what appears to be advanced dementia and a subjective severe-profound non functional swallow. Oral cavity cleaned followed by one teaspoon presentation of pudding. Pharyngeal swallow appears significantly weak with little movement, very loud/audible high pitched sound (likely discoordination). Immediate throat clear and she continued to swallow consistently for the next five minutes. Aspiration is highly suspected with max pharyngeal residue. Spoke with pt's daughter at home who reported she was eating mashed potatoes, eggs and drinking orange juice yesterday and denied coughing or difficulty. Recommend she contineu NPO status, oral care. ST will follow up tomorrow however concerned with ability to safely take po's in near future. Her dementia, decreased nutritional status and current condition may be difficult to overcome.      SLP Visit Diagnosis: Dysphagia, unspecified (R13.10)    Aspiration Risk  Severe aspiration risk;Risk for inadequate nutrition/hydration    Diet Recommendation NPO        Other  Recommendations Oral Care Recommendations: Oral care  QID   Follow up Recommendations None      Frequency and Duration min 1 x/week  2 weeks       Prognosis Prognosis for Safe Diet Advancement: Guarded Barriers to Reach Goals: Cognitive deficits;Severity of deficits      Swallow Study   General HPI: 85 y.o.femalewith medical history significant forAlzheimer's dementia, seizure disorder, presented to the ED due to fever,cough,worsening shortness of breath for 2days.  Patient noted to be positive for COVID-19.  Noted to be hypoxic.  Hospitalized for further management. Type of Study: Bedside Swallow Evaluation Previous Swallow Assessment: none Diet Prior to this Study: NPO Temperature Spikes Noted: No Respiratory Status: Nasal cannula History of Recent Intubation: No Behavior/Cognition: Alert;Requires cueing Oral Cavity Assessment: Dry Oral Care Completed by SLP: Yes Oral Cavity - Dentition: (could not fully view) Self-Feeding Abilities: Total assist Patient Positioning: Upright in bed Baseline Vocal Quality: Low vocal intensity Volitional Cough: Cognitively unable to elicit Volitional Swallow: Unable to elicit    Oral/Motor/Sensory Function Overall Oral Motor/Sensory Function: Generalized oral weakness   Ice Chips Ice chips: Not tested   Thin Liquid Thin Liquid: Not tested    Nectar Thick Nectar Thick Liquid: Not tested   Honey Thick Honey Thick Liquid: Not tested   Puree Puree: Impaired Presentation: Spoon Oral Phase Impairments: Reduced lingual movement/coordination Oral Phase Functional Implications: Oral residue Pharyngeal Phase Impairments: Multiple swallows;Decreased hyoid-laryngeal movement;Suspected delayed Swallow;Throat Clearing - Immediate   Solid     Solid: Not tested      Houston Siren 07/11/2019,2:37 PM   Orbie Pyo Colvin Caroli.Ed Risk analyst [phone] Office [phone]

## 2019-07-11 NOTE — Progress Notes (Signed)
Pt sleeping. Tolerating oxygen well. nsr on cardiac monitor. Pt on right side with pillow support between knees. Pt without urine output on this shift of 2300-0700, dr. Vanita Ingles was previously notified and awaiting BMP results to dertermine if ivf necessary.

## 2019-07-11 NOTE — Progress Notes (Signed)
Pt awake, no urine output noted. purewick remains in place. High flow [place] oxygen at 9lpm remains in place, pt tolerating. Pt turned to right side with pillow support behind back and between knees.

## 2019-07-11 NOTE — Progress Notes (Signed)
Initial Nutrition Assessment  DOCUMENTATION CODES:   Underweight  INTERVENTION:   -Once diet advanced, provide Ensure Enlive po BID, each supplement provides 350 kcal and 20 grams of protein  If unable to have diet advanced and within [place], recommend nutrition support.  NUTRITION DIAGNOSIS:   Increased nutrient needs related to acute illness as evidenced by estimated needs.  GOAL:   Patient will meet greater than or equal to 90% of their needs  MONITOR:   Labs, Weight trends, I & O's, Diet advancement, GOC  REASON FOR ASSESSMENT:   Malnutrition Screening Tool   ASSESSMENT:   85 y.o. female with medical history significant for Alzheimer's dementia, seizure disorder, presented to the ED due to fever, cough, worsening shortness of breath for 2 days.  Patient noted to be positive for COVID-19.  Noted to be hypoxic.  Hospitalized for further management.  **RD working remotely**  Patient with advanced dementia, currently disoriented x 4.  Patient admitted with fever and currently being treated for active COVID-19 infection. Pt currently NPO. Pt made DNR. If unable to eat, will need nutrition support if within [place].  Per weight records, last recorded weight PTA was 88 lbs on 04/05/18. Current weight is 94 lbs.   I/Os: +1.3L since admit  Labs reviewed. Medications: Vitamin C tablet, Zinc sulfate capsule  NUTRITION - FOCUSED PHYSICAL EXAM:  Working remotely.  Diet Order:   Diet Order            Diet NPO time specified  Diet effective now              EDUCATION NEEDS:   Not appropriate for education at this time  Skin:  Skin Assessment: Reviewed RN Assessment  Last BM:  PTA  Height:   Ht Readings from Last 1 Encounters:  07/11/19 5' (1.524 m)    Weight:   Wt Readings from Last 1 Encounters:  07/11/19 43 kg    Ideal Body Weight:  45.4 kg  BMI:  Body mass index is 18.51 kg/m.  Estimated Nutritional Needs:   Kcal:  1200-1400  Protein:   55-65g  Fluid:  1.5L/day  April Bibles, MS, RD, LDN Inpatient Clinical Dietitian Pager: [phone] After Hours Pager: [phone]

## 2019-07-11 NOTE — Progress Notes (Signed)
Notified dr. Vanita Ingles regarding pt's lack of urine output since admission, difficulty swallowing and high flow oxygen requirement. No new orders received.

## 2019-07-12 ENCOUNTER — Inpatient Hospital Stay (HOSPITAL_COMMUNITY): Payer: Medicare Other

## 2019-07-12 DIAGNOSIS — R221 Localized swelling, mass and lump, neck: Secondary | ICD-10-CM

## 2019-07-12 DIAGNOSIS — G9341 Metabolic encephalopathy: Secondary | ICD-10-CM

## 2019-07-12 LAB — CBC WITH DIFFERENTIAL/PLATELET
Abs Immature Granulocytes: 0.06 10*3/uL (ref 0.00–0.07)
Basophils Absolute: 0 10*3/uL (ref 0.0–0.1)
Basophils Relative: 0 %
Eosinophils Absolute: 0 10*3/uL (ref 0.0–0.5)
Eosinophils Relative: 0 %
HCT: 44.2 % (ref 36.0–46.0)
Hemoglobin: 14.7 g/dL (ref 12.0–15.0)
Immature Granulocytes: 1 %
Lymphocytes Relative: 9 %
Lymphs Abs: 0.5 10*3/uL — ABNORMAL LOW (ref 0.7–4.0)
MCH: 31.7 pg (ref 26.0–34.0)
MCHC: 33.3 g/dL (ref 30.0–36.0)
MCV: 95.3 fL (ref 80.0–100.0)
Monocytes Absolute: 0.2 10*3/uL (ref 0.1–1.0)
Monocytes Relative: 3 %
Neutro Abs: 4.7 10*3/uL (ref 1.7–7.7)
Neutrophils Relative %: 87 %
Platelets: 121 10*3/uL — ABNORMAL LOW (ref 150–400)
RBC: 4.64 MIL/uL (ref 3.87–5.11)
RDW: 13.2 % (ref 11.5–15.5)
WBC: 5.4 10*3/uL (ref 4.0–10.5)
nRBC: 0 % (ref 0.0–0.2)

## 2019-07-12 LAB — COMPREHENSIVE METABOLIC PANEL
ALT: 17 U/L (ref 0–44)
AST: 34 U/L (ref 15–41)
Albumin: 2.8 g/dL — ABNORMAL LOW (ref 3.5–5.0)
Alkaline Phosphatase: 37 U/L — ABNORMAL LOW (ref 38–126)
Anion gap: 18 — ABNORMAL HIGH (ref 5–15)
BUN: 24 mg/dL — ABNORMAL HIGH (ref 8–23)
CO2: 22 mmol/L (ref 22–32)
Calcium: 8.8 mg/dL — ABNORMAL LOW (ref 8.9–10.3)
Chloride: 108 mmol/L (ref 98–111)
Creatinine, Ser: 0.47 mg/dL (ref 0.44–1.00)
GFR calc Af Amer: >60 mL/min (ref >60)
GFR calc non Af Amer: >60 mL/min (ref >60)
Glucose, Bld: 119 mg/dL — ABNORMAL HIGH (ref 70–99)
Potassium: 4 mmol/L (ref 3.5–5.1)
Sodium: 148 mmol/L — ABNORMAL HIGH (ref 135–145)
Total Bilirubin: 1.4 mg/dL — ABNORMAL HIGH (ref 0.3–1.2)
Total Protein: 6.8 g/dL (ref 6.5–8.1)

## 2019-07-12 LAB — TYPE AND SCREEN
ABO/RH(D): O POS
Antibody Screen: NEGATIVE

## 2019-07-12 LAB — C-REACTIVE PROTEIN: CRP: 27.9 mg/dL — ABNORMAL HIGH (ref <1.0)

## 2019-07-12 LAB — D-DIMER, QUANTITATIVE: D-Dimer, Quant: 2.31 ug/mL-FEU — ABNORMAL HIGH (ref 0.00–0.50)

## 2019-07-12 LAB — PROCALCITONIN: Procalcitonin: 2.91 ng/mL

## 2019-07-12 LAB — MAGNESIUM: Magnesium: 1.7 mg/dL (ref 1.7–2.4)

## 2019-07-12 LAB — FERRITIN: Ferritin: 535 ng/mL — ABNORMAL HIGH (ref 11–307)

## 2019-07-12 MED ORDER — SODIUM CHLORIDE 0.9% IV SOLUTION
Freq: Once | INTRAVENOUS | Status: AC
  Administered 2019-07-12: 16:00:00 via INTRAVENOUS

## 2019-07-12 MED ORDER — FUROSEMIDE 10 MG/ML IJ SOLN
40.0000 mg | Freq: Once | INTRAMUSCULAR | Status: AC
  Administered 2019-07-12: 40 mg via INTRAVENOUS
  Filled 2019-07-12: qty 4

## 2019-07-12 MED ORDER — HYDRALAZINE HCL 20 MG/ML IJ SOLN
10.0000 mg | INTRAMUSCULAR | Status: DC | PRN
  Administered 2019-07-12 (×2): 10 mg via INTRAVENOUS
  Filled 2019-07-12 (×2): qty 1

## 2019-07-12 NOTE — Progress Notes (Signed)
Carotid artery duplex completed.  07/12/2019 2:03 PM Kelby Aline., MHA, RVT, RDCS, RDMS

## 2019-07-12 NOTE — Progress Notes (Addendum)
PROGRESS NOTE  April Lucero ZOX:[id number] DOB: 1932/09/02 DOA: 07/10/2019  PCP: Roberts Gaudy., MD  Brief History/Interval Summary: 85 y.o. female with medical history significant for Alzheimer's dementia, seizure disorder, presented to the ED due to fever, cough, worsening shortness of breath for 2 days.  Patient noted to be positive for COVID-19.  Noted to be hypoxic.  Hospitalized for further management.  Reason for Visit: Pneumonia due to COVID-19.  Acute respiratory failure with hypoxia.  Consultants: None  Procedures: None  Antibiotics: Anti-infectives (From admission, onward)   Start     Dose/Rate Route Frequency Ordered Stop   07/11/19 1200  cefTRIAXone (ROCEPHIN) 1 g in sodium chloride 0.9 % 100 mL IVPB     1 g 200 mL/hr over 30 Minutes Intravenous Every 24 hours 07/11/19 1054 07/16/19 1159   07/11/19 1200  azithromycin (ZITHROMAX) 500 mg in sodium chloride 0.9 % 250 mL IVPB     500 mg 250 mL/hr over 60 Minutes Intravenous Every 24 hours 07/11/19 1054 07/16/19 1159   07/11/19 1000  remdesivir 100 mg in sodium chloride 0.9 % 100 mL IVPB     100 mg 200 mL/hr over 30 Minutes Intravenous Daily 07/10/19 1747 07/15/19 0959   07/10/19 1800  remdesivir 200 mg in sodium chloride 0.9% 250 mL IVPB     200 mg 580 mL/hr over 30 Minutes Intravenous Once 07/10/19 1747 07/10/19 1948   07/10/19 1545  levofloxacin (LEVAQUIN) IVPB 500 mg     500 mg 100 mL/hr over 60 Minutes Intravenous  Once 07/10/19 1541 07/10/19 1734      Subjective/Interval History: Patient remains somnolent.  Does respond occasionally per nursing staff.    Assessment/Plan:  Acute Hypoxic Resp. Failure/Pneumonia due to COVID-19  COVID-19 Labs  Recent Labs  Lab 07/10/19 1548 07/10/19 1744 07/11/19 0000 07/11/19 0731 07/11/19 0800 07/12/19 0450  DDIMER  --  3.38*  --  2.65*  --  2.31*  FERRITIN  --  204  --  309*  --  535*  CRP  --  29.0*  --  31.0*  --  27.9*  ALT 14  --   --   --  12 17   PROCALCITON  --   --  5.74  --   --  2.91    Positive COVID-19 results available in epic from 07/10/2019.  Objective findings: Fever: Afebrile. Oxygen requirements: She has been weaned down to room air this morning.  Saturating in the 90s.  COVID 19 Therapeutics: Antibacterials: Ceftriaxone and Azithromycin started on 12/10 for 5-day course Remdesivir: Day 3 today Steroids: Dexamethasone 6 mg every 12 hours Diuretics: Not on diuretics on a scheduled basis Actemra: Not given yet Convalescent Plasma: Not given yet Vitamin C and Zinc: Continue PUD Prophylaxis: Pepcid DVT Prophylaxis:  Lovenox   From a respiratory standpoint patient seems to be stable.  She has been weaned down to room air this morning.  Saturating in the 90s.  Continue remdesivir and steroids.  Procalcitonin was elevated at 5.74 and so the patient was started on ceftriaxone and azithromycin.  Procalcitonin noted to be 2.91 today.  D-dimer improved to 2.31.  CRP slightly better at 27.9.  Not a candidate for Actemra due to elevated procalcitonin.  Since respiratory status has been improving we will hold off on convalescent plasma.  TSH was 0.853.  Once she is more awake and alert encourage her to do incentive spirometry and mobilize her.  However at baseline she does have limited mobility.  ADDENDUM Patient became hypoxic. CXR shows slight progression of infiltrates. It is possible that she aspirated. Discussed with daughters. They want to proceed with convalescent plasma. They were emailed the forms. They have reviewed the forms. I answered their questions. They have signed and emailed it back to me. I have ordered the plasma. Patient also given lasix.  Acute metabolic encephalopathy Patient remains encephalopathic.  Does not follow any commands.  Does wake up at times according to nursing staff.  We will monitor closely.  If there is no improvement in the next 24 hours may need to consider neuro imaging studies.  Small  pulsatile lump in the right neck base About 2 cm lump noted in the right neck base.  It is noted to be pulsatile.  No bruising noted.  Chronicity is unknown.  Will ask family members.  Carotid Dopplers have been ordered.  Could be a carotid body tumor.  History of Alzheimer's dementia Appears to be quite advanced.  Patient noted to be on Aricept [name] and Seroquel at home.  Seroquel is on hold currently.  History of seizure disorder No seizure type activity noted.  She is on intravenous Keppra due to poor oral intake.    Goals of care Discussed with patient's daughter.  Explained to her that considering her significant comorbidities including advanced dementia patient would not do well if she requires aggressive care including mechanical ventilation and CPR.  She had multiple questions which I answered.  She agreed to DNR status.   DVT Prophylaxis: Lovenox Code Status: DNR Family Communication: Daughter being updated daily. Disposition Plan: Lives with daughter.  Mobilize when she is more awake alert.    Medications:  Scheduled: . dexamethasone (DECADRON) injection  6 mg Intravenous Q12H  . donepezil  5 mg Oral QHS  . enoxaparin (LOVENOX) injection  30 mg Subcutaneous Q24H  . Ipratropium-Albuterol  1 puff Inhalation Q6H  . memantine  28 mg Oral QHS  . vitamin C  500 mg Oral Daily  . zinc sulfate  220 mg Oral Daily   Continuous: . sodium chloride Stopped (07/12/19 0959)  . azithromycin Stopped (07/11/19 2142)  . cefTRIAXone (ROCEPHIN)  IV Stopped (07/12/19 0000)  . famotidine (PEPCID) IV Stopped (07/11/19 2223)  . levETIRAcetam Stopped (07/12/19 0700)  . remdesivir 100 mg in NS 100 mL 100 mg (07/12/19 1006)   GGY:IRSWNIOEVOJJK, chlorpheniramine-HYDROcodone, guaiFENesin-dextromethorphan, hydrALAZINE, hydrALAZINE, ondansetron **OR** ondansetron (ZOFRAN) IV, senna-docusate, simethicone   Objective:  Vital Signs  Vitals:   07/12/19 0106 07/12/19 0317 07/12/19 0537 07/12/19  0800  BP: 140/86 (!) 175/64 120/80 139/80  Pulse: 64  88 80  Resp: 18  20 17   Temp: (!) 97.4 F (36.3 C)  (!) 97 F (36.1 C) 97.8 F (36.6 C)  TempSrc: Axillary  Axillary Axillary  SpO2: 100%  100% 100%  Weight:      Height:        Intake/Output Summary (Last 24 hours) at 07/12/2019 1042 Last data filed at 07/11/2019 1600 Gross per 24 hour  Intake 755.81 ml  Output 600 ml  Net 155.81 ml   Filed Weights   07/10/19 1704 07/11/19 0443  Weight: 43.1 kg 43 kg    General appearance: Somnolent. 2 cm oblong lump noted in the right neck base.  Noted to be pulsatile.  No bruising.  No erythema.  Firm to palpate. Resp: Normal effort at rest.  Coarse breath sound bilaterally with crackles at the bases. Cardio: S1-S2 is normal regular.  No S3-S4.  No  rubs murmurs or bruit GI: Abdomen is soft.  Nontender nondistended.  Bowel sounds are present normal.  No masses organomegaly Extremities: No edema.   Neurologic: No obvious facial asymmetry.  Does not follow any commands.    Lab Results:  Data Reviewed: I have personally reviewed following labs and imaging studies  CBC: Recent Labs  Lab 07/10/19 1548 07/12/19 0450  WBC 4.2 5.4  NEUTROABS 3.6 4.7  HGB 12.9 14.7  HCT 40.5 44.2  MCV 99.5 95.3  PLT 110* 121*    Basic Metabolic Panel: Recent Labs  Lab 07/10/19 1548 07/11/19 0800 07/12/19 0450  NA 144 145 148*  K 4.0 4.6 4.0  CL 104 107 108  CO2 31 26 22   GLUCOSE 89 107* 119*  BUN 18 20 24*  CREATININE 0.64 0.59 0.47  CALCIUM 8.9 8.5* 8.8*  MG  --   --  1.7    GFR: Estimated Creatinine Clearance: 35.5 mL/min (by C-G formula based on SCr of 0.47 mg/dL).  Liver Function Tests: Recent Labs  Lab 07/10/19 1548 07/11/19 0800 07/12/19 0450  AST 29 31 34  ALT 14 12 17   ALKPHOS 28* 31* 37*  BILITOT 1.3* 1.0 1.4*  PROT 6.9 5.9* 6.8  ALBUMIN 3.1* 2.5* 2.8*     Coagulation Profile: Recent Labs  Lab 07/10/19 1548  INR 1.0     Thyroid Function Tests:  Recent Labs    07/10/19 1744  TSH 0.853    Anemia Panel: Recent Labs    07/11/19 0731 07/12/19 0450  FERRITIN 309* 535*    Recent Results (from the past 240 hour(s))  Blood Culture (routine x 2)     Status: None (Preliminary result)   Collection Time: 07/10/19  3:48 PM   Specimen: BLOOD LEFT FOREARM  Result Value Ref Range Status   Specimen Description   Final    BLOOD LEFT FOREARM Performed at [facility], [address] [place], [place] [address]    Special Requests   Final    BOTTLES DRAWN AEROBIC ONLY Blood Culture results may not be optimal due to an inadequate volume of blood received in culture bottles Performed at [facility], [address] [place], [place] [address]    Culture   Final    NO GROWTH < 24 HOURS Performed at [facility], [address] [place], [place] [address]    Report Status PENDING  Incomplete  Urine culture     Status: None   Collection Time: 07/10/19  4:31 PM   Specimen: In/Out Cath Urine  Result Value Ref Range Status   Specimen Description   Final    IN/OUT CATH URINE Performed at [facility], [address] [place], [place] [address]    Special Requests   Final    NONE Performed at [facility], [address] [place], [place] [address]    Culture   Final    NO GROWTH Performed at [facility], [address] [place], [place] [address]    Report Status 07/11/2019 FINAL  Final      Radiology Studies: DG Chest Port 1 View  Result Date: 07/10/2019 CLINICAL DATA:  Fever and shortness of breath. Rhonchi. Tachypnea. Chest congestion. EXAM: PORTABLE CHEST 1 VIEW COMPARISON:  Radiographs dated 03/21/2018 FINDINGS: There are extensive bilateral pulmonary infiltrates, worse on the left than the right. Heart size and pulmonary vascularity are normal. No effusions. Aortic atherosclerosis. Slight compression fracture of T12,  likely old.  IMPRESSION: 1. Extensive bilateral pulmonary infiltrates, worse on the left than the right. 2.  Aortic Atherosclerosis (ICD10-I70.0). 3. Slight compression fracture of T12, likely old. Electronically Signed   By: Francene Boyers M.D.   On: 07/10/2019 16:04       LOS: 2 days   Brinlynn Gorton Rito Ehrlich  [facility] Pager on [email or web address]  07/12/2019, 10:42 AM

## 2019-07-13 LAB — COMPREHENSIVE METABOLIC PANEL
ALT: 22 U/L (ref 0–44)
AST: 51 U/L — ABNORMAL HIGH (ref 15–41)
Albumin: 3 g/dL — ABNORMAL LOW (ref 3.5–5.0)
Alkaline Phosphatase: 40 U/L (ref 38–126)
Anion gap: 17 — ABNORMAL HIGH (ref 5–15)
BUN: 26 mg/dL — ABNORMAL HIGH (ref 8–23)
CO2: 23 mmol/L (ref 22–32)
Calcium: 8.5 mg/dL — ABNORMAL LOW (ref 8.9–10.3)
Chloride: 110 mmol/L (ref 98–111)
Creatinine, Ser: 0.54 mg/dL (ref 0.44–1.00)
GFR calc Af Amer: >60 mL/min (ref >60)
GFR calc non Af Amer: >60 mL/min (ref >60)
Glucose, Bld: 159 mg/dL — ABNORMAL HIGH (ref 70–99)
Potassium: 3.1 mmol/L — ABNORMAL LOW (ref 3.5–5.1)
Sodium: 150 mmol/L — ABNORMAL HIGH (ref 135–145)
Total Bilirubin: 1.7 mg/dL — ABNORMAL HIGH (ref 0.3–1.2)
Total Protein: 6.8 g/dL (ref 6.5–8.1)

## 2019-07-13 LAB — CBC WITH DIFFERENTIAL/PLATELET
Abs Immature Granulocytes: 0.12 10*3/uL — ABNORMAL HIGH (ref 0.00–0.07)
Basophils Absolute: 0 10*3/uL (ref 0.0–0.1)
Basophils Relative: 0 %
Eosinophils Absolute: 0 10*3/uL (ref 0.0–0.5)
Eosinophils Relative: 0 %
HCT: 43.4 % (ref 36.0–46.0)
Hemoglobin: 14.5 g/dL (ref 12.0–15.0)
Immature Granulocytes: 2 %
Lymphocytes Relative: 5 %
Lymphs Abs: 0.4 10*3/uL — ABNORMAL LOW (ref 0.7–4.0)
MCH: 31.1 pg (ref 26.0–34.0)
MCHC: 33.4 g/dL (ref 30.0–36.0)
MCV: 93.1 fL (ref 80.0–100.0)
Monocytes Absolute: 0.1 10*3/uL (ref 0.1–1.0)
Monocytes Relative: 2 %
Neutro Abs: 7.4 10*3/uL (ref 1.7–7.7)
Neutrophils Relative %: 91 %
Platelets: 167 10*3/uL (ref 150–400)
RBC: 4.66 MIL/uL (ref 3.87–5.11)
RDW: 13.1 % (ref 11.5–15.5)
WBC: 8.1 10*3/uL (ref 4.0–10.5)
nRBC: 0.4 % — ABNORMAL HIGH (ref 0.0–0.2)

## 2019-07-13 LAB — D-DIMER, QUANTITATIVE: D-Dimer, Quant: 2.13 ug/mL-FEU — ABNORMAL HIGH (ref 0.00–0.50)

## 2019-07-13 LAB — FERRITIN: Ferritin: 510 ng/mL — ABNORMAL HIGH (ref 11–307)

## 2019-07-13 LAB — PROCALCITONIN: Procalcitonin: 2.28 ng/mL

## 2019-07-13 LAB — C-REACTIVE PROTEIN: CRP: 19.8 mg/dL — ABNORMAL HIGH (ref <1.0)

## 2019-07-13 LAB — ABO/RH: ABO/RH(D): O POS

## 2019-07-13 MED ORDER — FAMOTIDINE IN NACL 20-0.9 MG/50ML-% IV SOLN
20.0000 mg | INTRAVENOUS | Status: DC
  Administered 2019-07-13 – 2019-07-16 (×4): 20 mg via INTRAVENOUS
  Filled 2019-07-13 (×4): qty 50

## 2019-07-13 MED ORDER — LEVETIRACETAM IN NACL 500 MG/100ML IV SOLN
500.0000 mg | Freq: Two times a day (BID) | INTRAVENOUS | Status: DC
  Administered 2019-07-13 – 2019-07-16 (×8): 500 mg via INTRAVENOUS
  Filled 2019-07-13 (×10): qty 100

## 2019-07-13 MED ORDER — POTASSIUM CHLORIDE 10 MEQ/100ML IV SOLN
10.0000 meq | INTRAVENOUS | Status: AC
  Administered 2019-07-13 (×4): 10 meq via INTRAVENOUS
  Filled 2019-07-13 (×3): qty 100

## 2019-07-13 MED ORDER — ACETAMINOPHEN 650 MG RE SUPP
650.0000 mg | RECTAL | Status: DC | PRN
  Administered 2019-07-13: 650 mg via RECTAL
  Filled 2019-07-13: qty 1

## 2019-07-13 NOTE — Progress Notes (Signed)
PROGRESS NOTE  April Lucero WJX:[id number] DOB: 11/10/1932 DOA: 07/10/2019  PCP: Roberts Gaudy., MD  Brief History/Interval Summary: 85 y.o. female with medical history significant for Alzheimer's dementia, seizure disorder, presented to the ED due to fever, cough, worsening shortness of breath for 2 days.  Patient noted to be positive for COVID-19.  Noted to be hypoxic.  Hospitalized for further management.  Reason for Visit: Pneumonia due to COVID-19.  Acute respiratory failure with hypoxia.  Consultants: None  Procedures: None  Antibiotics: Anti-infectives (From admission, onward)   Start     Dose/Rate Route Frequency Ordered Stop   07/11/19 1200  cefTRIAXone (ROCEPHIN) 1 g in sodium chloride 0.9 % 100 mL IVPB     1 g 200 mL/hr over 30 Minutes Intravenous Every 24 hours 07/11/19 1054 07/16/19 1159   07/11/19 1200  azithromycin (ZITHROMAX) 500 mg in sodium chloride 0.9 % 250 mL IVPB     500 mg 250 mL/hr over 60 Minutes Intravenous Every 24 hours 07/11/19 1054 07/16/19 1159   07/11/19 1000  remdesivir 100 mg in sodium chloride 0.9 % 100 mL IVPB     100 mg 200 mL/hr over 30 Minutes Intravenous Daily 07/10/19 1747 07/15/19 0959   07/10/19 1800  remdesivir 200 mg in sodium chloride 0.9% 250 mL IVPB     200 mg 580 mL/hr over 30 Minutes Intravenous Once 07/10/19 1747 07/10/19 1948   07/10/19 1545  levofloxacin (LEVAQUIN) IVPB 500 mg     500 mg 100 mL/hr over 60 Minutes Intravenous  Once 07/10/19 1541 07/10/19 1734      Subjective/Interval History: Patient remains poorly responsive.  She is noted to be moaning somewhat today.  Does not respond to any of my questions.  Keeps her eyes closed.    Assessment/Plan:  Acute Hypoxic Resp. Failure/Pneumonia due to COVID-19  COVID-19 Labs  Recent Labs  Lab 07/10/19 1548 07/10/19 1744 07/11/19 0000 07/11/19 0731 07/11/19 0800 07/12/19 0450 07/13/19 0126  DDIMER  --  3.38*  --  2.65*  --  2.31* 2.13*  FERRITIN  --  204   --  309*  --  535* 510*  CRP  --  29.0*  --  31.0*  --  27.9* 19.8*  ALT 14  --   --   --  PROCALCITON  --   --  5.74  --   --  2.91 2.28    Positive COVID-19 results available in epic from 07/10/2019.  Objective findings: Fever: Remains afebrile. Oxygen requirements: Patient on a nonrebreather.  Saturating in the mid to late 90s  COVID 19 Therapeutics: Antibacterials: Ceftriaxone and Azithromycin started on 12/10 for 5-day course Remdesivir: Day 4 today Steroids: Dexamethasone 6 mg every 12 hours Diuretics: Not on diuretics on a scheduled basis Actemra: Not given yet Convalescent Plasma: Given on 12/12 Vitamin C and Zinc: Continue PUD Prophylaxis: Pepcid DVT Prophylaxis:  Lovenox   From a respiratory standpoint patient worsened yesterday afternoon.  Her oxygen requirements went from 2 to 3 L to nonrebreather.  Repeat chest x-ray showed slight progression of the pneumonia.  It is also possible that she may have aspirated.  Due to elevated procalcitonin secondary bacterial infection was a possibility.  Patient is on ceftriaxone and azithromycin.  D-dimer improving gradually.  Procalcitonin has improved to 2.13 now.  CRP is improved to 19.8.  Prognosis remains very guarded.  Acute metabolic encephalopathy Her baseline mental status is not clearly known.  There are conflicting reports.  According to nursing staff family mentioned that patient is nonverbal.  I was told that the patient does talk some.  Patient is very encephalopathic here in the hospital.  She is noted to be moaning today.  Continue to monitor for now.  Unlikely imaging studies will change management at this time.    Small pulsatile lump in the right neck base About 2 cm lump noted in the right neck base.  It is noted to be pulsatile.  No bruising noted.  Doppler suggests tortuosity of the common carotid artery.  This is a likely explanation for the lump.  No further work-up at this time.  .  Hypernatremia and  hypokalemia Replace potassium.  Recheck sodium levels tomorrow.  May need to initiate D5.  History of Alzheimer's dementia Appears to be quite advanced.  Patient noted to be on Aricept [name] and Seroquel at home.  Seroquel is on hold currently.  History of seizure disorder No seizure type activity noted.  She is on intravenous Keppra due to poor oral intake.    Goals of care I have discussed the patient's poor prognosis with the family members including 2 of her daughters extensively.  They seem to be in some denial regarding her medical condition and severity of illness.  We will continue to educate family members.  Continue current management for now.    DVT Prophylaxis: Lovenox Code Status: DNR Family Communication: Daughter being updated daily. Disposition Plan: Lives with daughter.  Mobilize when she is more awake alert.    Medications:  Scheduled: . dexamethasone (DECADRON) injection  6 mg Intravenous Q12H  . donepezil  5 mg Oral QHS  . enoxaparin (LOVENOX) injection  30 mg Subcutaneous Q24H  . Ipratropium-Albuterol  1 puff Inhalation Q6H  . memantine  28 mg Oral QHS  . vitamin C  500 mg Oral Daily  . zinc sulfate  220 mg Oral Daily   Continuous: . azithromycin Stopped (07/13/19 0053)  . cefTRIAXone (ROCEPHIN)  IV Stopped (07/13/19 0053)  . famotidine (PEPCID) IV    . levETIRAcetam 500 mg (07/13/19 1041)  . remdesivir 100 mg in NS 100 mL 100 mg (07/13/19 1115)   NUU:[id number], acetaminophen, chlorpheniramine-HYDROcodone, guaiFENesin-dextromethorphan, hydrALAZINE, hydrALAZINE, ondansetron **OR** ondansetron (ZOFRAN) IV, senna-docusate, simethicone   Objective:  Vital Signs  Vitals:   07/13/19 0530 07/13/19 0751 07/13/19 1329 07/13/19 1330  BP: (!) 143/88 (!) 139/94    Pulse: 93 65    Resp: 20 20    Temp: 98 F (36.7 C) 98.1 F (36.7 C)    TempSrc: Axillary Axillary    SpO2: 93% 94% 98% 100%  Weight:      Height:        Intake/Output Summary (Last 24  hours) at 07/13/2019 1346 Last data filed at 07/13/2019 0815 Gross per 24 hour  Intake 2633 ml  Output --  Net 2633 ml   Filed Weights   07/10/19 1704 07/11/19 0443  Weight: 43.1 kg 43 kg    General appearance: Somnolent.  A little bit more responsive today compared to yesterday. The lump in the right neck base is stable. Resp: Noted to be tachypneic.  Coarse breath sounds with crackles bilaterally.  No wheezing or rhonchi. Cardio: S1-S2 is normal regular.  No S3-S4.  No rubs murmurs or bruit GI: Abdomen is soft.  Nontender nondistended.  Bowel sounds are present normal.  No masses organomegaly Extremities: No edema.   Neurologic: No obvious focal deficits.    Lab Results:  Data Reviewed:  I have personally reviewed following labs and imaging studies  CBC: Recent Labs  Lab 07/10/19 1548 07/12/19 0450 07/13/19 0126  WBC 4.2 5.4 8.1  NEUTROABS 3.6 4.7 7.4  HGB 12.9 14.7 14.5  HCT 40.5 44.2 43.4  MCV 99.5 95.3 93.1  PLT 110* 121* 167    Basic Metabolic Panel: Recent Labs  Lab 07/10/19 1548 07/11/19 0800 07/12/19 0450 07/13/19 0126  NA 144 145 148* 150*  K 4.0 4.6 4.0 3.1*  CL 104 107 108 110  CO2 GLUCOSE 89 107* 119* 159*  BUN 18 20 24* 26*  CREATININE 0.64 0.59 0.47 0.54  CALCIUM 8.9 8.5* 8.8* 8.5*  MG  --   --  1.7  --     GFR: Estimated Creatinine Clearance: 35.5 mL/min (by C-G formula based on SCr of 0.54 mg/dL).  Liver Function Tests: Recent Labs  Lab 07/10/19 1548 07/11/19 0800 07/12/19 0450 07/13/19 0126  AST 29 31 34 51*  ALT ALKPHOS 28* 31* 37* 40  BILITOT 1.3* 1.0 1.4* 1.7*  PROT 6.9 5.9* 6.8 6.8  ALBUMIN 3.1* 2.5* 2.8* 3.0*     Coagulation Profile: Recent Labs  Lab 07/10/19 1548  INR 1.0     Thyroid Function Tests: Recent Labs    07/10/19 1744  TSH 0.853    Anemia Panel: Recent Labs    07/12/19 0450 07/13/19 0126  FERRITIN 535* 510*    Recent Results (from the past 240 hour(s))  Blood  Culture (routine x 2)     Status: None (Preliminary result)   Collection Time: 07/10/19  3:48 PM   Specimen: BLOOD LEFT FOREARM  Result Value Ref Range Status   Specimen Description   Final    BLOOD LEFT FOREARM Performed at [facility], [address], [place], [place] [address]    Special Requests   Final    BOTTLES DRAWN AEROBIC ONLY Blood Culture results may not be optimal due to an inadequate volume of blood received in culture bottles Performed at [facility], [address] [place], [place] [address]    Culture   Final    NO GROWTH 3 DAYS Performed at [facility], [address] [place], [place] [address]    Report Status PENDING  Incomplete  Urine culture     Status: None   Collection Time: 07/10/19  4:31 PM   Specimen: In/Out Cath Urine  Result Value Ref Range Status   Specimen Description   Final    IN/OUT CATH URINE Performed at [facility], [address], [place], [place] [address]    Special Requests   Final    NONE Performed at [facility], [address] [place], [place] [address]    Culture   Final    NO GROWTH Performed at [facility], [address], [place], [place] [address]    Report Status 07/11/2019 FINAL  Final      Radiology Studies: DG CHEST PORT 1 VIEW  Result Date: 07/12/2019 CLINICAL DATA:  Hypoxia EXAM: PORTABLE CHEST 1 VIEW COMPARISON:  07/10/2019 FINDINGS: Normal cardiac silhouette. Dense airspace disease in the LEFT lower lobe similar to comparison exam. There is biapical pleural thickening which is more prominent. Perihilar airspace disease in the RIGHT is stable. IMPRESSION: 1. Overall no interval change. 2. Dense lower lobe airspace disease and RIGHT perihilar airspace disease. 3. Upper lobe apical thickening is more  prominent than prior. Electronically Signed   By: Suzy Bouchard M.D.   On: 07/12/2019 14:13   VAS US CAROTID  Result Date:  07/13/2019 Carotid Arterial Duplex Study Indications:       Pulsatile lump in right neck. Limitations        Today's exam was limited due to the patient's inability or                    unwillingness to cooperate. Comparison Study:  No prior study Performing Technologist: Maudry Mayhew MHA, RDMS, RVT, RDCS  Examination Guidelines: A complete evaluation includes B-mode imaging, spectral Doppler, color Doppler, and power Doppler as needed of all accessible portions of each vessel. Bilateral testing is considered an integral part of a complete examination. Limited examinations for reoccurring indications may be performed as noted.  Right Carotid Findings: +----------+--------+--------+--------+------------------+------------------+           PSV cm/sEDV cm/sStenosisPlaque DescriptionComments           +----------+--------+--------+--------+------------------+------------------+ CCA Prox  67                      focal and calcifictortuous           +----------+--------+--------+--------+------------------+------------------+ CCA Distal40      5                                 intimal thickening +----------+--------+--------+--------+------------------+------------------+ ICA Prox  24      2                                                    +----------+--------+--------+--------+------------------+------------------+  Left Carotid Findings: +----------+--------+--------+--------+------------------+------------------+           PSV cm/sEDV cm/sStenosisPlaque DescriptionComments           +----------+--------+--------+--------+------------------+------------------+ CCA Prox  80      5                                                    +----------+--------+--------+--------+------------------+------------------+ CCA Distal39      4                                 intimal thickening +----------+--------+--------+--------+------------------+------------------+ ICA Prox   23      3               calcific                             +----------+--------+--------+--------+------------------+------------------+ ICA Distal80      12                                                   +----------+--------+--------+--------+------------------+------------------+ ECA       29                      calcific                             +----------+--------+--------+--------+------------------+------------------+  Summary: This was a limited exam due to poor patient cooperation. Right Carotid: Pulsatile area of the right neck is tortuosity of the proximal                common carotid artery. Limited visualization of the proximal                internal carotid artery reveals no obvious evidence of                hemodynamically significant stenosis. Left Carotid: Velocities in the left ICA are consistent with a 1-39% stenosis.  *See table(s) above for measurements and observations.  Electronically signed by Lemar LivingsBrandon Cain MD on 07/13/2019 at 10:19:33 AM.   Final        LOS: 3 days   Osvaldo ShipperGokul Adaeze Better  [facility] Pager on [email or web address]  07/13/2019, 1:46 PM

## 2019-07-13 NOTE — Progress Notes (Signed)
SLP Cancellation Note  Patient Details Name: April Lucero MRN: [id number] DOB: 07-19-1933   Cancelled treatment:  Pt not sufficiently alert to participate or take POs; on NRB.  SLP will not be on campus Sunday, 12/13, but will be available Monday 12/14.  D/W RN.  Kinslie Hove L. Tivis Ringer, [place] CCC/SLP Acute Rehabilitation Services Office number [phone] Pager [phone]                                                                                                 Juan Quam Laurice 07/13/2019, 1:13 PM

## 2019-07-13 NOTE — Progress Notes (Signed)
Spoke with patient's daughter, updated her on patient's condition and answered any questions she had.  

## 2019-07-13 NOTE — Progress Notes (Signed)
Updated daughter on patient's condition after patient received plasma. Patient is stable and resting at this time.

## 2019-07-13 NOTE — Progress Notes (Signed)
Spoke with patient's daughter over the phone who gave consent to give patient convalescent plasma. Verified with Loralie Champagne, RN.

## 2019-07-14 DIAGNOSIS — Z515 Encounter for palliative care: Secondary | ICD-10-CM

## 2019-07-14 DIAGNOSIS — E870 Hyperosmolality and hypernatremia: Secondary | ICD-10-CM

## 2019-07-14 LAB — COMPREHENSIVE METABOLIC PANEL
ALT: 30 U/L (ref 0–44)
AST: 76 U/L — ABNORMAL HIGH (ref 15–41)
Albumin: 2.7 g/dL — ABNORMAL LOW (ref 3.5–5.0)
Alkaline Phosphatase: 51 U/L (ref 38–126)
Anion gap: 15 (ref 5–15)
BUN: 33 mg/dL — ABNORMAL HIGH (ref 8–23)
CO2: 25 mmol/L (ref 22–32)
Calcium: 8.3 mg/dL — ABNORMAL LOW (ref 8.9–10.3)
Chloride: 115 mmol/L — ABNORMAL HIGH (ref 98–111)
Creatinine, Ser: 0.52 mg/dL (ref 0.44–1.00)
GFR calc Af Amer: >60 mL/min (ref >60)
GFR calc non Af Amer: >60 mL/min (ref >60)
Glucose, Bld: 179 mg/dL — ABNORMAL HIGH (ref 70–99)
Potassium: 3.1 mmol/L — ABNORMAL LOW (ref 3.5–5.1)
Sodium: 155 mmol/L — ABNORMAL HIGH (ref 135–145)
Total Bilirubin: 1.3 mg/dL — ABNORMAL HIGH (ref 0.3–1.2)
Total Protein: 6.2 g/dL — ABNORMAL LOW (ref 6.5–8.1)

## 2019-07-14 LAB — FERRITIN: Ferritin: 466 ng/mL — ABNORMAL HIGH (ref 11–307)

## 2019-07-14 LAB — CBC WITH DIFFERENTIAL/PLATELET
Abs Immature Granulocytes: 0.1 10*3/uL — ABNORMAL HIGH (ref 0.00–0.07)
Basophils Absolute: 0 10*3/uL (ref 0.0–0.1)
Basophils Relative: 0 %
Eosinophils Absolute: 0 10*3/uL (ref 0.0–0.5)
Eosinophils Relative: 0 %
HCT: 41.9 % (ref 36.0–46.0)
Hemoglobin: 13.7 g/dL (ref 12.0–15.0)
Immature Granulocytes: 1 %
Lymphocytes Relative: 2 %
Lymphs Abs: 0.3 10*3/uL — ABNORMAL LOW (ref 0.7–4.0)
MCH: 31.3 pg (ref 26.0–34.0)
MCHC: 32.7 g/dL (ref 30.0–36.0)
MCV: 95.7 fL (ref 80.0–100.0)
Monocytes Absolute: 0.1 10*3/uL (ref 0.1–1.0)
Monocytes Relative: 1 %
Neutro Abs: 11.9 10*3/uL — ABNORMAL HIGH (ref 1.7–7.7)
Neutrophils Relative %: 96 %
Platelets: 178 10*3/uL (ref 150–400)
RBC: 4.38 MIL/uL (ref 3.87–5.11)
RDW: 13.4 % (ref 11.5–15.5)
WBC: 12.4 10*3/uL — ABNORMAL HIGH (ref 4.0–10.5)
nRBC: 0.2 % (ref 0.0–0.2)

## 2019-07-14 LAB — C-REACTIVE PROTEIN: CRP: 15.2 mg/dL — ABNORMAL HIGH (ref <1.0)

## 2019-07-14 LAB — MAGNESIUM: Magnesium: 1.6 mg/dL — ABNORMAL LOW (ref 1.7–2.4)

## 2019-07-14 LAB — D-DIMER, QUANTITATIVE: D-Dimer, Quant: 1.95 ug/mL-FEU — ABNORMAL HIGH (ref 0.00–0.50)

## 2019-07-14 MED ORDER — POTASSIUM CHLORIDE 10 MEQ/100ML IV SOLN
10.0000 meq | INTRAVENOUS | Status: AC
  Administered 2019-07-14: 16:00:00 10 meq via INTRAVENOUS
  Filled 2019-07-14: qty 100

## 2019-07-14 MED ORDER — POTASSIUM CL IN DEXTROSE 5% 20 MEQ/L IV SOLN
20.0000 meq | INTRAVENOUS | Status: DC
  Administered 2019-07-14 – 2019-07-16 (×3): 20 meq via INTRAVENOUS
  Filled 2019-07-14 (×3): qty 1000

## 2019-07-14 MED ORDER — MAGNESIUM SULFATE 2 GM/50ML IV SOLN
2.0000 g | Freq: Once | INTRAVENOUS | Status: AC
  Administered 2019-07-14: 2 g via INTRAVENOUS
  Filled 2019-07-14: qty 50

## 2019-07-14 NOTE — Progress Notes (Signed)
PROGRESS NOTE  April Lucero ZOX:[id number] DOB: [birth year] DOA: 07/10/2019  PCP: Arman Bogus., MD  Brief History/Interval Summary: 85 y.o. female with medical history significant for Alzheimer's dementia, seizure disorder, presented to the ED due to fever, cough, worsening shortness of breath for 2 days.  Patient noted to be positive for COVID-19.  Noted to be hypoxic.  Hospitalized for further management.  Reason for Visit: Pneumonia due to COVID-19.  Acute respiratory failure with hypoxia.  Consultants: None  Procedures: None  Antibiotics: Anti-infectives (From admission, onward)   Start     Dose/Rate Route Frequency Ordered Stop   07/11/19 1200  cefTRIAXone (ROCEPHIN) 1 g in sodium chloride 0.9 % 100 mL IVPB     1 g 200 mL/hr over 30 Minutes Intravenous Every 24 hours 07/11/19 1054 07/16/19 1159   07/11/19 1200  azithromycin (ZITHROMAX) 500 mg in sodium chloride 0.9 % 250 mL IVPB     500 mg 250 mL/hr over 60 Minutes Intravenous Every 24 hours 07/11/19 1054 07/16/19 1159   07/11/19 1000  remdesivir 100 mg in sodium chloride 0.9 % 100 mL IVPB     100 mg 200 mL/hr over 30 Minutes Intravenous Daily 07/10/19 1747 07/15/19 0959   07/10/19 1800  remdesivir 200 mg in sodium chloride 0.9% 250 mL IVPB     200 mg 580 mL/hr over 30 Minutes Intravenous Once 07/10/19 1747 07/10/19 1948   07/10/19 1545  levofloxacin (LEVAQUIN) IVPB 500 mg     500 mg 100 mL/hr over 60 Minutes Intravenous  Once 07/10/19 1541 07/10/19 1734      Subjective/Interval History: Patient remains poorly responsive.  We have not seen any change in the last 24 to 48 hours.    Assessment/Plan:  Acute Hypoxic Resp. Failure/Pneumonia due to COVID-19  COVID-19 Labs  Recent Labs  Lab 07/10/19 1548 07/10/19 1744 07/11/19 0000 07/11/19 0731 07/11/19 0800 07/12/19 0450 07/13/19 0126 07/14/19 0455  DDIMER  --  3.38*  --  2.65*  --  2.31* 2.13* 1.95*  FERRITIN  --  204  --  309*  --  535* 510* 466*    CRP  --  29.0*  --  31.0*  --  27.9* 19.8* 15.2*  ALT 14  --   --   --  12 17 22 30   PROCALCITON  --   --  5.74  --   --  2.91 2.28  --     Positive COVID-19 results available in epic from 07/10/2019.  Objective findings: Fever: Remains afebrile Oxygen requirements: On a partial nonrebreather.  Saturating in the late 90s.  Should be able to change her over to nasal cannula.    COVID 19 Therapeutics: Antibacterials: Ceftriaxone and Azithromycin started on 12/10 for 5-day course Remdesivir: Day 5 today Steroids: Dexamethasone 6 mg every 12 hours Diuretics: Not on diuretics on a scheduled basis Actemra: Not given yet Convalescent Plasma: Given on 12/12 Vitamin C and Zinc: Continue PUD Prophylaxis: Pepcid DVT Prophylaxis:  Lovenox   From a respiratory standpoint patient worsened on 12/11.  Her oxygen requirement increased significantly.  Chest x-ray did show progression of pneumonia.  She remains on remdesivir and steroids.  It is also possible that she may have aspirated.  Patient is on ceftriaxone and azithromycin for a 5-day course.  D-dimer improved to 1.95.  Ferritin 466.  CRP is down to 15.2.  Procalcitonin was elevated at 5.74 and improved to 2.28 today.  Continue current management.  Prognosis remains guarded.  Acute  metabolic encephalopathy Her baseline mental status is not clearly known.  There are conflicting reports.  According to nursing staff family mentioned that patient is nonverbal.  I was told that the patient does talk some.  Patient remains very encephalopathic in the hospital.  She is noted to be moaning at times.  She is noted to move her lower extremities at times.  Prognosis is guarded at this time.    Small pulsatile lump in the right neck base due to tortuous common carotid artery About 2 cm lump noted in the right neck base.  It is noted to be pulsatile.  No bruising noted.  Doppler suggests tortuosity of the common carotid artery.  This is a likely explanation for  the lump.  No further work-up at this time.  .  Hypernatremia and hypokalemia Sodium level has gone up significantly.  We will put her on D5 water.  She also is noted to have hypokalemia which will be repleted.  Magnesium will also be repleted.    History of Alzheimer's dementia Appears to be quite advanced.  Patient noted to be on Aricept [name] and Seroquel at home.  Seroquel is on hold currently.  History of seizure disorder No seizure type activity noted.  She is on intravenous Keppra due to poor oral intake.    Goals of care I have discussed the patient's poor prognosis with the family members including 2 of her daughters extensively.  They seem to be in some denial regarding her medical condition and severity of illness.  We will continue to educate family members.  Continue current management for now.  May need to involve palliative medicine to assist with goals of care.  DVT Prophylaxis: Lovenox Code Status: DNR Family Communication: Daughter being updated daily. Disposition Plan: Lives with daughter.  Mobilize when she is more awake alert.    Medications:  Scheduled: . dexamethasone (DECADRON) injection  6 mg Intravenous Q12H  . donepezil  5 mg Oral QHS  . enoxaparin (LOVENOX) injection  30 mg Subcutaneous Q24H  . Ipratropium-Albuterol  1 puff Inhalation Q6H  . memantine  28 mg Oral QHS  . vitamin C  500 mg Oral Daily  . zinc sulfate  220 mg Oral Daily   Continuous: . azithromycin Stopped (07/14/19 0031)  . cefTRIAXone (ROCEPHIN)  IV Stopped (07/13/19 2212)  . famotidine (PEPCID) IV Stopped (07/14/19 0031)  . levETIRAcetam Stopped (07/14/19 0739)  . remdesivir 100 mg in NS 100 mL Stopped (07/13/19 1933)   WUJ:WJXBJYNWGNFAO, acetaminophen, chlorpheniramine-HYDROcodone, guaiFENesin-dextromethorphan, hydrALAZINE, hydrALAZINE, ondansetron **OR** ondansetron (ZOFRAN) IV, senna-docusate, simethicone   Objective:  Vital Signs  Vitals:   07/13/19 1541 07/13/19 2016  07/14/19 0540 07/14/19 0838  BP: (!) 142/95 133/76 134/71   Pulse: 96 75 67   Resp: Temp: 97.7 F (36.5 C) 97.9 F (36.6 C) 97.6 F (36.4 C)   TempSrc: Axillary Axillary Axillary   SpO2: 100% 100% 100% 98%  Weight:      Height:        Intake/Output Summary (Last 24 hours) at 07/14/2019 1037 Last data filed at 07/13/2019 1430 Gross per 24 hour  Intake 1000 ml  Output --  Net 1000 ml   Filed Weights   07/10/19 1704 07/11/19 0443  Weight: 43.1 kg 43 kg    General appearance: Somnolent.  Poorly responsive. Resp: Mildly tachypneic at rest.  Coarse breath sound bilaterally with crackles at the bases.  No wheezing or rhonchi.   Cardio: S1-S2 is  normal regular.  No S3-S4.  No rubs murmurs or bruit GI: Abdomen is soft.  Nontender nondistended.  Bowel sounds are present normal.  No masses organomegaly Neurologic: Not very responsive.  No obvious focal deficits.    Lab Results:  Data Reviewed: I have personally reviewed following labs and imaging studies  CBC: Recent Labs  Lab 07/10/19 1548 07/12/19 0450 07/13/19 0126 07/14/19 0455  WBC 4.2 5.4 8.1 12.4*  NEUTROABS 3.6 4.7 7.4 11.9*  HGB 12.9 14.7 14.5 13.7  HCT 40.5 44.2 43.4 41.9  MCV 99.5 95.3 93.1 95.7  PLT 110* 121* 167 178    Basic Metabolic Panel: Recent Labs  Lab 07/10/19 1548 07/11/19 0800 07/12/19 0450 07/13/19 0126 07/14/19 0455  NA 144 145 148* 150* 155*  K 4.0 4.6 4.0 3.1* 3.1*  CL 104 107 108 110 115*  CO2 31 26 22 23 25   GLUCOSE 89 107* 119* 159* 179*  BUN 18 20 24* 26* 33*  CREATININE 0.64 0.59 0.47 0.54 0.52  CALCIUM 8.9 8.5* 8.8* 8.5* 8.3*  MG  --   --  1.7  --  1.6*    GFR: Estimated Creatinine Clearance: 35.5 mL/min (by C-G formula based on SCr of 0.52 mg/dL).  Liver Function Tests: Recent Labs  Lab 07/10/19 1548 07/11/19 0800 07/12/19 0450 07/13/19 0126 07/14/19 0455  AST 29 31 34 51* 76*  ALT 14 12 17 22 30   ALKPHOS 28* 31* 37* 40 51  BILITOT 1.3* 1.0 1.4*  1.7* 1.3*  PROT 6.9 5.9* 6.8 6.8 6.2*  ALBUMIN 3.1* 2.5* 2.8* 3.0* 2.7*     Coagulation Profile: Recent Labs  Lab 07/10/19 1548  INR 1.0     Anemia Panel: Recent Labs    07/13/19 0126 07/14/19 0455  FERRITIN 510* 466*    Recent Results (from the past 240 hour(s))  Blood Culture (routine x 2)     Status: None (Preliminary result)   Collection Time: 07/10/19  3:48 PM   Specimen: BLOOD LEFT FOREARM  Result Value Ref Range Status   Specimen Description   Final    BLOOD LEFT FOREARM Performed at [facility], [address] [place], [place] [address]    Special Requests   Final    BOTTLES DRAWN AEROBIC ONLY Blood Culture results may not be optimal due to an inadequate volume of blood received in culture bottles Performed at [facility], [address] [place], [place] [address]    Culture   Final    NO GROWTH 4 DAYS Performed at [facility], [address] [place], [place] [address]    Report Status PENDING  Incomplete  Urine culture     Status: None   Collection Time: 07/10/19  4:31 PM   Specimen: In/Out Cath Urine  Result Value Ref Range Status   Specimen Description   Final    IN/OUT CATH URINE Performed at [facility], [address] [place], [place] [address]    Special Requests   Final    NONE Performed at [facility], [address] [place], [place] [address]    Culture   Final    NO GROWTH Performed at [facility], [address] [place], [place] [address]    Report Status 07/11/2019 FINAL  Final      Radiology Studies: DG CHEST PORT 1 VIEW  Result Date: 07/12/2019 CLINICAL DATA:  Hypoxia EXAM: PORTABLE CHEST 1 VIEW COMPARISON:  07/10/2019 FINDINGS:  Normal cardiac silhouette. Dense airspace disease in the LEFT lower lobe similar to comparison exam. There is biapical pleural thickening which is more prominent. Perihilar airspace disease in the RIGHT is  stable. IMPRESSION: 1. Overall no interval change. 2. Dense lower lobe airspace disease and RIGHT perihilar airspace disease. 3. Upper lobe apical thickening is more prominent than prior. Electronically Signed   By: Genevive Bi M.D.   On: 07/12/2019 14:13   VAS US CAROTID  Result Date: 07/13/2019 Carotid Arterial Duplex Study Indications:       Pulsatile lump in right neck. Limitations        Today's exam was limited due to the patient's inability or                    unwillingness to cooperate. Comparison Study:  No prior study Performing Technologist: Gertie Fey MHA, RDMS, RVT, RDCS  Examination Guidelines: A complete evaluation includes B-mode imaging, spectral Doppler, color Doppler, and power Doppler as needed of all accessible portions of each vessel. Bilateral testing is considered an integral part of a complete examination. Limited examinations for reoccurring indications may be performed as noted.  Right Carotid Findings: +----------+--------+--------+--------+------------------+------------------+           PSV cm/sEDV cm/sStenosisPlaque DescriptionComments           +----------+--------+--------+--------+------------------+------------------+ CCA Prox  67                      focal and calcifictortuous           +----------+--------+--------+--------+------------------+------------------+ CCA Distal40      5                                 intimal thickening +----------+--------+--------+--------+------------------+------------------+ ICA Prox  24      2                                                    +----------+--------+--------+--------+------------------+------------------+  Left Carotid Findings: +----------+--------+--------+--------+------------------+------------------+           PSV cm/sEDV cm/sStenosisPlaque DescriptionComments           +----------+--------+--------+--------+------------------+------------------+ CCA Prox  80      5                                                     +----------+--------+--------+--------+------------------+------------------+ CCA Distal39      4                                 intimal thickening +----------+--------+--------+--------+------------------+------------------+ ICA Prox  23      3               calcific                             +----------+--------+--------+--------+------------------+------------------+ ICA Distal80      12                                                   +----------+--------+--------+--------+------------------+------------------+  ECA       29                      calcific                             +----------+--------+--------+--------+------------------+------------------+  Summary: This was a limited exam due to poor patient cooperation. Right Carotid: Pulsatile area of the right neck is tortuosity of the proximal                common carotid artery. Limited visualization of the proximal                internal carotid artery reveals no obvious evidence of                hemodynamically significant stenosis. Left Carotid: Velocities in the left ICA are consistent with a 1-39% stenosis.  *See table(s) above for measurements and observations.  Electronically signed by Lemar Livings MD on 07/13/2019 at 10:19:33 AM.   Final        LOS: 4 days   Osvaldo Shipper  [facility] Pager on [email or web address]  07/14/2019, 10:37 AM

## 2019-07-14 NOTE — Progress Notes (Signed)
Daughter called for update on patient. This RN updated daughter on patient's status and plan of care. Will continue to monitor.

## 2019-07-14 NOTE — Consult Note (Addendum)
Consultation Note Date: 07/14/2019   Patient Name: April Lucero  DOB: 25-Dec-1932  MRN: [id number]  Age / Sex: 85 y.o., female  PCP: April Bogus., MD Referring Physician: Bonnielee Haff, MD  Reason for Consultation: Establishing goals of care and Psychosocial/spiritual support  HPI/Patient Profile: 85 y.o. female  with past medical history of alzheimer's dementia and seizure disorder who was admitted on 07/10/2019 with fever and cough.  She was found to be positive for COVID and have bilateral pneumonia.  The patient has not improved in the hospital and unfortunately is now obtunded.  She is still receiving maximal therapies.  Clinical Assessment and Goals of Care:  I have reviewed medical records including EPIC notes, labs and imaging, received report from Dr. [place] Lucero, and then called her daughters April Lucero (sp?) and April Lucero to discuss diagnosis prognosis, GOC, EOL wishes, disposition and options.  I introduced Palliative Medicine as specialized medical care for people living with serious illness. It focuses on providing relief from the symptoms and stress of a serious illness. The goal is to improve quality of life for both the patient and the family.  We discussed a brief life review of the patient.  She and her husband have lived with their daughter April Lucero for for many years.  They explain that she has had Alzheimers for 15 years and it is very advanced.  Both daughters expressed how blessed they feel that she is still living.  At baseline the patient eats very little - the family has to feed her and strongly encourage her to eat.  She speaks very little.  Occasionally she will say the word mother "April Lucero" to her daughter April Lucero.  She speaks very few words and very rarely.  April Lucero is able to walk from room to room with her daughter assisting her.  The patient is spanish speaking.  We discussed  her current illness and what it means in the larger context of her on-going co-morbidities.  Natural disease trajectory and expectations at EOL were discussed.  I explained that with every illness dementia becomes worse, but with COVID because of oxygen deprivation to the brain patients with dementia are especially hard hit.  The daughters asked why she was dehydrated - I assured them that we are doing our best to rehydrate her and replace electrolytes but because of her illness she is losing more water than she normally would.  We discussed the current level of oxygen she is requiring (10L on NRB mask).  We discussed that April Lucero is not eating or drinking and she is not responding.  Her daughters felt that this is her dementia - they believe it is how she normally is at home.  April Lucero comments that she will be better if they can just get her home with her family, in her familiar environment and familiar food.  The daughters describe making a great effort to make their mother eat.  They say it is normal for her not to respond.  The daughters asked if one of them  could visit.  I explained that we only do that for dying patients, but they could face time or video.  April Lucero has an Theme park managerPhone and would like to try to video with her mother.     We discussed finishing her treatment for COVID and if she is somewhat stable getting her home with hospice services.  April Lucero was immediately receptive to this idea.  April Lucero lost her husband approximately 1 year ago and she objected.  She asked her sister - isn't Hospice for people who are dying?  They spoke in Spanish for several minutes.    I explained that Hospice is a form of home health nursing that is covered by medicare.  If a doctor is able to determine that it is likely a patient has 6 months or less to live they are eligible for Hospice services in their home.  It is the best support we can offer in a person's home.  April Lucero agreed that she would like to have  nursing care visit the home.  We discussed that if April Lucero improved and hospice was no longer needed they could discontinue services.  However given her very advanced dementia, that is now worsened by COVID, she qualifies for home health services thru Hospice.   Both daughters agreed that this would be a good idea.   Questions and concerns were addressed.   The family was encouraged to call with questions or concerns.   Primary Decision Maker:  NEXT OF KIN    SUMMARY OF RECOMMENDATIONS     If possible daughters want to take their mother home with hospice services after she completes treatment for COVID.  At baseline she could walk but essentially did not speak and it was very difficult to make her eat.  If possible speak in spanish to the patient.  If possible please face time or video call with the patient and her daughters.  This would help the daughters gain a better understanding of their mother's condition.  Code Status/Advance Care Planning:  DNR   Symptom Management:   Per primary team  Additional Recommendations (Limitations, Scope, Preferences):  Full Scope Treatment  Psycho-social/Spiritual:   Desire for further Chaplaincy support: not discussed, but patient is April KnucklesChristian per her daughters  Prognosis:  At high risk for further decompensation and death given COVID and very poor PO intake.  Patient qualifies for Hospice services based on being FAST 7C or beyond with Alzheimers dementia    Discharge Planning: Home with Hospice       Primary Diagnoses: Present on Admission: . Pneumonia due to COVID-19 virus . Alzheimer disease (HCC)   I have reviewed the medical record, interviewed the patient and family, and examined the patient. The following aspects are pertinent.  Past Medical History:  Diagnosis Date  . Alzheimer disease (HCC)   . Seizures (HCC)    Social History   Socioeconomic History  . Marital status: Married    Spouse name: Not on  file  . Number of children: Not on file  . Years of education: Not on file  . Highest education level: Not on file  Occupational History  . Not on file  Tobacco Use  . Smoking status: Unknown If Ever Smoked  Substance and Sexual Activity  . Alcohol use: Not on file  . Drug use: Not on file  . Sexual activity: Not on file  Other Topics Concern  . Not on file  Social History Narrative  . Not on file   Social  Determinants of Health   Financial Resource Strain:   . Difficulty of Paying Living Expenses: Not on file  Food Insecurity:   . Worried About Programme researcher, broadcasting/film/video in the Last Year: Not on file  . Ran Out of Food in the Last Year: Not on file  Transportation Needs:   . Lack of Transportation (Medical): Not on file  . Lack of Transportation (Non-Medical): Not on file  Physical Activity:   . Days of Exercise per Week: Not on file  . Minutes of Exercise per Session: Not on file  Stress:   . Feeling of Stress : Not on file  Social Connections:   . Frequency of Communication with Friends and Family: Not on file  . Frequency of Social Gatherings with Friends and Family: Not on file  . Attends Religious Services: Not on file  . Active Member of Clubs or Organizations: Not on file  . Attends Banker Meetings: Not on file  . Marital Status: Not on file   No family history on file. Scheduled Meds: . dexamethasone (DECADRON) injection  6 mg Intravenous Q12H  . donepezil  5 mg Oral QHS  . enoxaparin (LOVENOX) injection  30 mg Subcutaneous Q24H  . Ipratropium-Albuterol  1 puff Inhalation Q6H  . memantine  28 mg Oral QHS  . vitamin C  500 mg Oral Daily  . zinc sulfate  220 mg Oral Daily   Continuous Infusions: . azithromycin Stopped (07/14/19 0031)  . cefTRIAXone (ROCEPHIN)  IV 1 g (07/14/19 1451)  . dextrose 5 % with KCl 20 mEq / L 20 mEq (07/14/19 1223)  . famotidine (PEPCID) IV Stopped (07/14/19 0031)  . levETIRAcetam Stopped (07/14/19 0739)   PRN  Meds:.acetaminophen, acetaminophen, chlorpheniramine-HYDROcodone, guaiFENesin-dextromethorphan, hydrALAZINE, hydrALAZINE, ondansetron **OR** ondansetron (ZOFRAN) IV, senna-docusate, simethicone No Known Allergies  Vital Signs: BP 134/71 (BP Location: Right Arm)   Pulse 67   Temp 97.6 F (36.4 C) (Axillary)   Resp 20   Ht 5' (1.524 m)   Wt 43 kg   SpO2 98%   BMI 18.51 kg/m  Pain Scale: Faces       SpO2: SpO2: 98 % O2 Device:SpO2: 98 % O2 Flow Rate: .O2 Flow Rate (L/min): 10 L/min(found on 5lpm increased to 10)  IO: Intake/output summary: No intake or output data in the 24 hours ending 07/14/19 1633  LBM: Last BM Date: 07/11/19 Baseline Weight: Weight: 43.1 kg Most recent weight: Weight: 43 kg     Palliative Assessment/Data: 10%     Time In: 4:00 Time Out: 5:00 Time Total: 60 min. Visit consisted of counseling and education dealing with the complex and emotionally intense issues surrounding the need for palliative care and symptom management in the setting of serious and potentially life-threatening illness. Greater than 50%  of this time was spent counseling and coordinating care related to the above assessment and plan.  The above conversation was completed via telephone due to the restrictions during the COVID-19 pandemic. Thorough chart review and discussion with necessary members of the care team was completed as part of assessment. All issues were discussed and addressed but no physical exam was performed.  Signed by: Norvel Richards, PA-C Palliative Medicine Pager: [phone]  Please contact Palliative Medicine Team phone at [phone] for questions and concerns.  For individual provider: See Loretha Stapler

## 2019-07-14 NOTE — Plan of Care (Signed)
  Problem: Education: Goal: Knowledge of General Education information will improve Description: Including pain rating scale, medication(s)/side effects and non-pharmacologic comfort measures Outcome: Not Progressing   Problem: Health Behavior/Discharge Planning: Goal: Ability to manage health-related needs will improve Outcome: Not Progressing   Problem: Clinical Measurements: Goal: Ability to maintain clinical measurements within normal limits will improve Outcome: Not Progressing Goal: Will remain free from infection Outcome: Not Progressing Goal: Diagnostic test results will improve Outcome: Not Progressing Goal: Respiratory complications will improve Outcome: Not Progressing Goal: Cardiovascular complication will be avoided Outcome: Not Progressing  Once treatment is completed patient is to be discharged to home with hospice

## 2019-07-14 NOTE — Progress Notes (Signed)
Spoke with patient's daughter to update her on patient's condition.

## 2019-07-15 LAB — PREPARE FRESH FROZEN PLASMA

## 2019-07-15 LAB — CBC WITH DIFFERENTIAL/PLATELET
Abs Immature Granulocytes: 0.11 10*3/uL — ABNORMAL HIGH (ref 0.00–0.07)
Basophils Absolute: 0 10*3/uL (ref 0.0–0.1)
Basophils Relative: 0 %
Eosinophils Absolute: 0 10*3/uL (ref 0.0–0.5)
Eosinophils Relative: 0 %
HCT: 43.6 % (ref 36.0–46.0)
Hemoglobin: 14 g/dL (ref 12.0–15.0)
Immature Granulocytes: 1 %
Lymphocytes Relative: 2 %
Lymphs Abs: 0.3 10*3/uL — ABNORMAL LOW (ref 0.7–4.0)
MCH: 31.7 pg (ref 26.0–34.0)
MCHC: 32.1 g/dL (ref 30.0–36.0)
MCV: 98.9 fL (ref 80.0–100.0)
Monocytes Absolute: 0.2 10*3/uL (ref 0.1–1.0)
Monocytes Relative: 2 %
Neutro Abs: 12.4 10*3/uL — ABNORMAL HIGH (ref 1.7–7.7)
Neutrophils Relative %: 95 %
Platelets: 228 10*3/uL (ref 150–400)
RBC: 4.41 MIL/uL (ref 3.87–5.11)
RDW: 13.6 % (ref 11.5–15.5)
WBC: 13.1 10*3/uL — ABNORMAL HIGH (ref 4.0–10.5)
nRBC: 0.2 % (ref 0.0–0.2)

## 2019-07-15 LAB — COMPREHENSIVE METABOLIC PANEL WITH GFR
ALT: 38 U/L (ref 0–44)
AST: 85 U/L — ABNORMAL HIGH (ref 15–41)
Albumin: 2.8 g/dL — ABNORMAL LOW (ref 3.5–5.0)
Alkaline Phosphatase: 58 U/L (ref 38–126)
Anion gap: 12 (ref 5–15)
BUN: 34 mg/dL — ABNORMAL HIGH (ref 8–23)
CO2: 27 mmol/L (ref 22–32)
Calcium: 8.5 mg/dL — ABNORMAL LOW (ref 8.9–10.3)
Chloride: 114 mmol/L — ABNORMAL HIGH (ref 98–111)
Creatinine, Ser: 0.67 mg/dL (ref 0.44–1.00)
GFR calc Af Amer: >60 mL/min
GFR calc non Af Amer: >60 mL/min
Glucose, Bld: 306 mg/dL — ABNORMAL HIGH (ref 70–99)
Potassium: 3.7 mmol/L (ref 3.5–5.1)
Sodium: 153 mmol/L — ABNORMAL HIGH (ref 135–145)
Total Bilirubin: 1.4 mg/dL — ABNORMAL HIGH (ref 0.3–1.2)
Total Protein: 6.4 g/dL — ABNORMAL LOW (ref 6.5–8.1)

## 2019-07-15 LAB — CULTURE, BLOOD (ROUTINE X 2): Culture: NO GROWTH

## 2019-07-15 LAB — BPAM FFP
Blood Product Expiration Date: 202012122250
ISSUE DATE / TIME: 202012120000
Unit Type and Rh: 5100

## 2019-07-15 LAB — FERRITIN: Ferritin: 518 ng/mL — ABNORMAL HIGH (ref 11–307)

## 2019-07-15 LAB — D-DIMER, QUANTITATIVE: D-Dimer, Quant: 2.81 ug/mL-FEU — ABNORMAL HIGH (ref 0.00–0.50)

## 2019-07-15 LAB — C-REACTIVE PROTEIN: CRP: 13.2 mg/dL — ABNORMAL HIGH

## 2019-07-15 MED ORDER — DEXAMETHASONE SODIUM PHOSPHATE 10 MG/ML IJ SOLN
6.0000 mg | INTRAMUSCULAR | Status: DC
  Administered 2019-07-16: 6 mg via INTRAVENOUS
  Filled 2019-07-15: qty 1

## 2019-07-15 NOTE — Progress Notes (Signed)
[facility]  Received request from [facility] for patient/family interest in hospice services at home after discharge. Chart reviewed and eligibility confirmed by hospice physician.   Spoke with patient's daughter Geoffry Paradise by phone to confirm interest, explain services and answer questions. Hospital bed and oxygen setup ordered for delivery to home at address in chart. [name] is contact to coordinate delivery to home.   Please send signed and completed DNR home with patient.   Please make arrangements for patient to have scripts for any medication she does not already have.   [facility] Specialist will contact [name] to arrange first visit in home after discharge.   Please call with hospice related questions.   Please check AMION for [facility] Liaison covering under Hospice and [facility].   Thank you,  Erling Conte, LCSW [phone]

## 2019-07-15 NOTE — Progress Notes (Signed)
Daughter [name] called and was able to facetime with mother. Daughter updated on status and plan of care.

## 2019-07-15 NOTE — Progress Notes (Signed)
MD paged regarding LOC and inability to get temp. Placed on NRB, sats 95

## 2019-07-15 NOTE — TOC Initial Note (Signed)
Transition of Care [facility]) - Initial/Assessment Note    Patient Details  Name: April Lucero MRN: [id number] Date of Birth: 1932/12/06  [facility] CM/SW Contact:    Joaquin Courts, RN Phone Number: 07/15/2019, 11:14 AM  Clinical Narrative:                 Cm received referral for home hospice services. CM spoke with patient's daughter [name]. Referral given to [place].   Expected Discharge Plan: Home w Hospice Care Barriers to Discharge: Continued Medical Work up   Patient Goals and CMS Choice Patient states their goals for this hospitalization and ongoing recovery are:: to go home with hospice CMS Medicare.gov Compare Post Acute Care list provided to:: Patient Represenative (must comment) Choice offered to / list presented to : Adult Children  Expected Discharge Plan and Services Expected Discharge Plan: Home w Hospice Care   Discharge Planning Services: CM Consult Post Acute Care Choice: Hospice Living arrangements for the past 2 months: Apartment                                      Prior Living Arrangements/Services Living arrangements for the past 2 months: Apartment Lives with:: Adult Children Patient language and need for interpreter reviewed:: Yes Do you feel safe going back to the place where you live?: Yes      Need for Family Participation in Patient Care: Yes (Comment) Care giver support system in place?: Yes (comment)   Criminal Activity/Legal Involvement Pertinent to Current Situation/Hospitalization: No - Comment as needed  Activities of Daily Living Home Assistive Devices/Equipment: Other (Comment), None ADL Screening (condition at time of admission) Patient's cognitive ability adequate to safely complete daily activities?: No Is the patient deaf or have difficulty hearing?: No Does the patient have difficulty seeing, even when wearing glasses/contacts?: No Does the patient have difficulty concentrating,  remembering, or making decisions?: Yes Patient able to express need for assistance with ADLs?: No Does the patient have difficulty dressing or bathing?: Yes Independently performs ADLs?: No Communication: Independent Dressing (OT): Dependent Is this a change from baseline?: Pre-admission baseline Grooming: Dependent Is this a change from baseline?: Pre-admission baseline Feeding: Dependent Is this a change from baseline?: Pre-admission baseline Bathing: Dependent Is this a change from baseline?: Pre-admission baseline Toileting: Dependent Is this a change from baseline?: Pre-admission baseline In/Out Bed: Dependent Is this a change from baseline?: Pre-admission baseline Walks in Home: Dependent Is this a change from baseline?: Pre-admission baseline Does the patient have difficulty walking or climbing stairs?: Yes Weakness of Legs: Both Weakness of Arms/Hands: Both  Permission Sought/Granted   Permission granted to share information with : Yes, Verbal Permission Granted     Permission granted to share info w AGENCY: [facility]        Emotional Assessment           Psych Involvement: No (comment)  Admission diagnosis:  possible Covid, congestion, cough, fever Patient Active Problem List   Diagnosis Date Noted  . Pneumonia due to COVID-19 virus 07/10/2019  . Alzheimer disease ([place])   . Seizures ([place])    PCP:  Arman Bogus., MD Pharmacy:  No Pharmacies Listed    Social Determinants of Health (SDOH) Interventions    Readmission Risk Interventions No flowsheet data found.

## 2019-07-15 NOTE — Progress Notes (Signed)
PROGRESS NOTE  April CitrinFresa M Lucero [id number] DOB: 06/09/1933 DOA: 07/10/2019  PCP: [name], [name], MD  Brief History/Interval Summary: 85 y.o. female with medical history significant for Alzheimer's dementia, seizure disorder, presented to the ED due to fever, cough, worsening shortness of breath for 2 days.  Patient noted to be positive for COVID-19.  Noted to be hypoxic.  Hospitalized for further management.  Reason for Visit: Pneumonia due to COVID-19.  Acute respiratory failure with hypoxia.  Consultants: None  Procedures: None  Antibiotics: Anti-infectives (From admission, onward)   Start     Dose/Rate Route Frequency Ordered Stop   07/11/19 1200  cefTRIAXone (ROCEPHIN) 1 g in sodium chloride 0.9 % 100 mL IVPB     1 g 200 mL/hr over 30 Minutes Intravenous Every 24 hours 07/11/19 1054 07/15/19 1207   07/11/19 1200  azithromycin (ZITHROMAX) 500 mg in sodium chloride 0.9 % 250 mL IVPB     500 mg 250 mL/hr over 60 Minutes Intravenous Every 24 hours 07/11/19 1054 07/15/19 1300   07/11/19 1000  remdesivir 100 mg in sodium chloride 0.9 % 100 mL IVPB     100 mg 200 mL/hr over 30 Minutes Intravenous Daily 07/10/19 1747 07/14/19 1449   07/10/19 1800  remdesivir 200 mg in sodium chloride 0.9% 250 mL IVPB     200 mg 580 mL/hr over 30 Minutes Intravenous Once 07/10/19 1747 07/10/19 1948   07/10/19 1545  levofloxacin (LEVAQUIN) IVPB 500 mg     500 mg 100 mL/hr over 60 Minutes Intravenous  Once 07/10/19 1541 07/10/19 1734      Subjective/Interval History: Patient noted to have her eyes open.  But still not responsive.  No purposeful movements noted.  .    Assessment/Plan:  Acute Hypoxic Resp. Failure/Pneumonia due to COVID-19  COVID-19 Labs  Recent Labs  Lab 07/11/19 0000 07/11/19 0731 07/11/19 0800 07/12/19 0450 07/13/19 0126 07/14/19 0455 07/15/19 0425  DDIMER  --  2.65*  --  2.31* 2.13* 1.95* 2.81*  FERRITIN  --  309*  --  535* 510* 466* 518*  CRP  --  31.0*  --   27.9* 19.8* 15.2* 13.2*  ALT  --   --  12 17 22 30  38  PROCALCITON 5.74  --   --  2.91 2.28  --   --     Positive COVID-19 results available in epic from 07/10/2019.  Objective findings: Fever: Remains afebrile Oxygen requirements: Has been switched over to high flow nasal cannula from nonrebreather.  Currently on 3 L/min.  Saturating in the mid 90s.    COVID 19 Therapeutics: Antibacterials: Ceftriaxone and Azithromycin started on 12/10 for 5-day course Remdesivir: Completed course on 12/13 Steroids: Dexamethasone 6 mg every 12 hours Diuretics: Not on diuretics on a scheduled basis Actemra: Not given yet Convalescent Plasma: Given on 12/12 Vitamin C and Zinc: Continue PUD Prophylaxis: Pepcid DVT Prophylaxis:  Lovenox   From a respiratory standpoint patient is stable.  She was doing well initially requiring only 2 L.  She worsened on 12/11.  Chest x-ray showed progression of pneumonia.  She remained on remdesivir and steroids.  She was given convalescent plasma after discussions with family.  CRP has improved to 13.2.  D-dimer 2.81.  Ferritin 518.  She was also given ceftriaxone and azithromycin for 5-day course.  Procalcitonin was initially 5.74 and improved to 2.28.  Acute metabolic encephalopathy Her baseline mental status is not clearly known.  There are conflicting reports.  According to nursing staff  family mentioned that patient is nonverbal.  I was told that the patient does talk some.  Patient remains encephalopathic.  Over the last 24-48 hrs she is not as agitated.  Still unsafe for oral intake.  Continue to monitor closely.  Prognosis remains guarded.    Small pulsatile lump in the right neck base due to tortuous common carotid artery About 2 cm lump noted in the right neck base.  It is noted to be pulsatile.  No bruising noted.  Doppler suggests tortuosity of the common carotid artery.  This is a likely explanation for the lump.  No further work-up at this time.   .  Hypernatremia and hypokalemia Started on D5 water yesterday.  Sodium level slightly better today compared to yesterday.  Recheck tomorrow.  Potassium is normal at 3.7.  Check magnesium again tomorrow.     History of Alzheimer's dementia Appears to be quite advanced.  Patient noted to be on Aricept [name] and Seroquel at home.  Seroquel is on hold currently.  History of seizure disorder No seizure type activity noted.  She is on intravenous Keppra due to poor oral intake.    Goals of care Patient with poor long-term prognosis considering her poor baseline.  Palliative medicine was consulted.  They have discussed with family.  Family agreeable to hospice at home.  Wait for a little bit more stability before discharge.    DVT Prophylaxis: Lovenox Code Status: DNR Family Communication: Daughter being updated daily. Disposition Plan: Plan is for discharge home with hospice services when patient is more stable.    Medications:  Scheduled: . dexamethasone (DECADRON) injection  6 mg Intravenous Q12H  . donepezil  5 mg Oral QHS  . enoxaparin (LOVENOX) injection  30 mg Subcutaneous Q24H  . Ipratropium-Albuterol  1 puff Inhalation Q6H  . memantine  28 mg Oral QHS  . vitamin C  500 mg Oral Daily  . zinc sulfate  220 mg Oral Daily   Continuous: . dextrose 5 % with KCl 20 mEq / L 20 mEq (07/15/19 0543)  . famotidine (PEPCID) IV 20 mg (07/14/19 1652)  . levETIRAcetam 500 mg (07/15/19 1007)   QIO:[id number], acetaminophen, chlorpheniramine-HYDROcodone, guaiFENesin-dextromethorphan, hydrALAZINE, hydrALAZINE, ondansetron **OR** ondansetron (ZOFRAN) IV, senna-docusate, simethicone   Objective:  Vital Signs  Vitals:   07/15/19 0904 07/15/19 0930 07/15/19 0959 07/15/19 1159  BP: 120/71     Pulse: 63     Resp: 16     Temp:      TempSrc:      SpO2: 100% 95% 92% 95%  Weight:      Height:        Intake/Output Summary (Last 24 hours) at 07/15/2019 1337 Last data filed at  07/15/2019 1300 Gross per 24 hour  Intake 2627.5 ml  Output 100 ml  Net 2527.5 ml   Filed Weights   07/10/19 1704 07/11/19 0443  Weight: 43.1 kg 43 kg    General appearance: Remains poorly responsive Resp: Normal effort at rest today.  Coarse breath sound bilaterally.  Few crackles at the bases.  No wheezing or rhonchi.   Cardio: S1-S2 is normal regular.  No S3-S4.  No rubs murmurs or bruit GI: Abdomen is soft.  Nontender nondistended.  Bowel sounds are present normal.  No masses organomegaly Neurologic: Not responsive.  No obvious deficits.    Lab Results:  Data Reviewed: I have personally reviewed following labs and imaging studies  CBC: Recent Labs  Lab 07/10/19 1548 07/12/19 0450 07/13/19 0126 07/14/19  0455 07/15/19 0425  WBC 4.2 5.4 8.1 12.4* 13.1*  NEUTROABS 3.6 4.7 7.4 11.9* 12.4*  HGB 12.9 14.7 14.5 13.7 14.0  HCT 40.5 44.2 43.4 41.9 43.6  MCV 99.5 95.3 93.1 95.7 98.9  PLT 110* 121* 167 178 063    Basic Metabolic Panel: Recent Labs  Lab 07/11/19 0800 07/12/19 0450 07/13/19 0126 07/14/19 0455 07/15/19 0425  NA 145 148* 150* 155* 153*  K 4.6 4.0 3.1* 3.1* 3.7  CL 107 108 110 115* 114*  CO2 26 22 23 25 27   GLUCOSE 107* 119* 159* 179* 306*  BUN 20 24* 26* 33* 34*  CREATININE 0.59 0.47 0.54 0.52 0.67  CALCIUM 8.5* 8.8* 8.5* 8.3* 8.5*  MG  --  1.7  --  1.6*  --     GFR: Estimated Creatinine Clearance: 35.5 mL/min (by C-G formula based on SCr of 0.67 mg/dL).  Liver Function Tests: Recent Labs  Lab 07/11/19 0800 07/12/19 0450 07/13/19 0126 07/14/19 0455 07/15/19 0425  AST 31 34 51* 76* 85*  ALT 12 17 22 30  38  ALKPHOS 31* 37* 40 51 58  BILITOT 1.0 1.4* 1.7* 1.3* 1.4*  PROT 5.9* 6.8 6.8 6.2* 6.4*  ALBUMIN 2.5* 2.8* 3.0* 2.7* 2.8*     Coagulation Profile: Recent Labs  Lab 07/10/19 1548  INR 1.0     Anemia Panel: Recent Labs    07/14/19 0455 07/15/19 0425  FERRITIN 466* 518*    Recent Results (from the past 240 hour(s))   Blood Culture (routine x 2)     Status: None   Collection Time: 07/10/19  3:48 PM   Specimen: BLOOD LEFT FOREARM  Result Value Ref Range Status   Specimen Description   Final    BLOOD LEFT FOREARM Performed at [facility], [place] [address] [place], [place] [address]    Special Requests   Final    BOTTLES DRAWN AEROBIC ONLY Blood Culture results may not be optimal due to an inadequate volume of blood received in culture bottles Performed at [place] [address] [place], [place] [address]    Culture   Final    NO GROWTH 5 DAYS Performed at [facility], [place] [address], [place], [place] [address]    Report Status 07/15/2019 FINAL  Final  Urine culture     Status: None   Collection Time: 07/10/19  4:31 PM   Specimen: In/Out Cath Urine  Result Value Ref Range Status   Specimen Description   Final    IN/OUT CATH URINE Performed at [place] [address] [place], [place] [address]    Special Requests   Final    NONE Performed at [facility], [place] [address], [place], [place] [address]    Culture   Final    NO GROWTH Performed at [facility], [place] [address] [place], [place] [address]    Report Status 07/11/2019 FINAL  Final      Radiology Studies: No results found.     LOS: 5 days   Natalija Mavis [employer] on [email or web address]  07/15/2019, 1:37 PM

## 2019-07-15 NOTE — Progress Notes (Addendum)
  Speech Language Pathology Treatment: Dysphagia  Patient Details Name: April Lucero MRN: [id number] DOB: 05-10-1933 Today's Date: 07/15/2019 Time: 8921-1941 SLP Time Calculation (min) (ACUTE ONLY): 21 min  Assessment / Plan / Recommendation Clinical Impression  SLP f/u after initial swallow assessment.  Pt presented with wet, congested, audible exhalations.  Facetimed her daughter, Geoffry Paradise, so that she could speak to her mother in Spanish and offer encouragement and cues. We discussed her mother's swallowing.  Pt offered two, 1/2 teaspoon-sized boluses of water, to which she showed no reaction. There was no intentional effort, no recognition of material despite its presence in oral cavity.  No spontaneous swallowing was observed nor could be elicited during our session.  April Lucero was tearful, particularly about the loud expirations that were not present this morning.  We talked about pt being unable to swallow foods or liquids or even her saliva, which is why it was accumulating in her throat and causing the congested breathing. WE discussed that she is likely aspirating even her secretions.  April Lucero verbalized understanding and stated that "it is in God's hands." Pt's swallow function, as poor as it was on 12/10, has deteriorated further.  D/W RN.  SLP will follow for further education if warranted.    HPI HPI: 85 y.o.femalewith medical history significant forAlzheimer's dementia, seizure disorder, presented to the ED due to fever,cough,worsening shortness of breath for 2days.  Patient noted to be positive for COVID-19.  Noted to be hypoxic.  Hospitalized for further management.      SLP Plan  Continue with current plan of care       Recommendations  Diet recommendations: NPO                Oral Care Recommendations: Oral care QID Follow up Recommendations: None Plan: Continue with current plan of care       GO              Jakwon Gayton L. Tivis Ringer, MA CCC/SLP Acute  Rehabilitation Services Office number [phone]    Juan Quam Laurice 07/15/2019, 4:18 PM

## 2019-07-16 LAB — COMPREHENSIVE METABOLIC PANEL
ALT: 35 U/L (ref 0–44)
AST: 49 U/L — ABNORMAL HIGH (ref 15–41)
Albumin: 2.7 g/dL — ABNORMAL LOW (ref 3.5–5.0)
Alkaline Phosphatase: 59 U/L (ref 38–126)
Anion gap: 12 (ref 5–15)
BUN: 41 mg/dL — ABNORMAL HIGH (ref 8–23)
CO2: 24 mmol/L (ref 22–32)
Calcium: 8.4 mg/dL — ABNORMAL LOW (ref 8.9–10.3)
Chloride: 112 mmol/L — ABNORMAL HIGH (ref 98–111)
Creatinine, Ser: 0.84 mg/dL (ref 0.44–1.00)
GFR calc Af Amer: >60 mL/min (ref >60)
GFR calc non Af Amer: >60 mL/min (ref >60)
Glucose, Bld: 403 mg/dL — ABNORMAL HIGH (ref 70–99)
Potassium: 3.9 mmol/L (ref 3.5–5.1)
Sodium: 148 mmol/L — ABNORMAL HIGH (ref 135–145)
Total Bilirubin: 1.7 mg/dL — ABNORMAL HIGH (ref 0.3–1.2)
Total Protein: 6.2 g/dL — ABNORMAL LOW (ref 6.5–8.1)

## 2019-07-16 LAB — GLUCOSE, CAPILLARY
Glucose-Capillary: 325 mg/dL — ABNORMAL HIGH (ref 70–99)
Glucose-Capillary: 257 mg/dL — ABNORMAL HIGH (ref 70–99)
Glucose-Capillary: 194 mg/dL — ABNORMAL HIGH (ref 70–99)
Glucose-Capillary: 176 mg/dL — ABNORMAL HIGH (ref 70–99)

## 2019-07-16 LAB — MAGNESIUM: Magnesium: 2.1 mg/dL (ref 1.7–2.4)

## 2019-07-16 MED ORDER — INSULIN ASPART 100 UNIT/ML ~~LOC~~ SOLN
0.0000 [IU] | SUBCUTANEOUS | Status: DC
  Administered 2019-07-16: 4 [IU] via SUBCUTANEOUS
  Administered 2019-07-16: 15 [IU] via SUBCUTANEOUS
  Administered 2019-07-16: 11 [IU] via SUBCUTANEOUS
  Administered 2019-07-17: 3 [IU] via SUBCUTANEOUS

## 2019-07-16 MED ORDER — POTASSIUM CL IN DEXTROSE 5% 20 MEQ/L IV SOLN
20.0000 meq | INTRAVENOUS | Status: DC
  Administered 2019-07-16 – 2019-07-17 (×2): 20 meq via INTRAVENOUS
  Filled 2019-07-16 (×2): qty 1000

## 2019-07-16 MED ORDER — DEXAMETHASONE SODIUM PHOSPHATE 4 MG/ML IJ SOLN: 4.0000 mg | INTRAMUSCULAR | Status: DC

## 2019-07-16 NOTE — Progress Notes (Signed)
Palliative Care Progress Note  Case discussed with Dr. [place] Lucero. Family had questions related to feeding and nutrition and readiness to be discharged. I spoke with patient's daughter April Lucero who is the primary care giver. We discussed possible trajectories and that improvement in the hospital is not possible due to the nature of delirium and the impact of being out of her familiar environment. I discussed trajectories and prepared her that her moth was likely not going to survive for very long- I also shared with her that occasionally patient's with delirium and dementia do iprove slightly when they return home and that if her mother did wake up enough to take small amounts of careful hand feeding that hospice could help determine the safety of that and whether is was providing comfort. April Lucero knows that her mother is likely coming home for EOL so that she can be around loved ones. Goals are comfort and no additional aggressive medical interventions.  Plan for d/c in AM-equipment is in the home. Hospice will follow.  April Hacker, DO Palliative Medicine [phone]  Time: 35 min Greater than 50%  of this time was spent counseling and coordinating care related to the above assessment and plan.

## 2019-07-16 NOTE — Progress Notes (Addendum)
PROGRESS NOTE  April Lucero:[id number]:[id number] DOB: 02/13/1933 DOA: 07/10/2019  PCP: [name], [name], MD  Brief History/Interval Summary: 85 y.o. female with medical history significant for Alzheimer's dementia, seizure disorder, presented to the ED due to fever, cough, worsening shortness of breath for 2 days.  Patient noted to be positive for COVID-19.  Noted to be hypoxic.  Hospitalized for further management.  Reason for Visit: Pneumonia due to COVID-19.  Acute respiratory failure with hypoxia.  Consultants: None  Procedures: None  Antibiotics: Anti-infectives (From admission, onward)   Start     Dose/Rate Route Frequency Ordered Stop   07/11/19 1200  cefTRIAXone (ROCEPHIN) 1 g in sodium chloride 0.9 % 100 mL IVPB     1 g 200 mL/hr over 30 Minutes Intravenous Every 24 hours 07/11/19 1054 07/15/19 1300   07/11/19 1200  azithromycin (ZITHROMAX) 500 mg in sodium chloride 0.9 % 250 mL IVPB     500 mg 250 mL/hr over 60 Minutes Intravenous Every 24 hours 07/11/19 1054 07/15/19 1313   07/11/19 1000  remdesivir 100 mg in sodium chloride 0.9 % 100 mL IVPB     100 mg 200 mL/hr over 30 Minutes Intravenous Daily 07/10/19 1747 07/14/19 1449   07/10/19 1800  remdesivir 200 mg in sodium chloride 0.9% 250 mL IVPB     200 mg 580 mL/hr over 30 Minutes Intravenous Once 07/10/19 1747 07/10/19 1948   07/10/19 1545  levofloxacin (LEVAQUIN) IVPB 500 mg     500 mg 100 mL/hr over 60 Minutes Intravenous  Once 07/10/19 1541 07/10/19 1734      Subjective/Interval History: Patient remains poorly responsive.  No significant change compared to yesterday.    Assessment/Plan:  Acute Hypoxic Resp. Failure/Pneumonia due to COVID-19  COVID-19 Labs  Recent Labs  Lab 07/10/19 1744 07/11/19 0000 07/11/19 0731 07/12/19 0450 07/13/19 0126 07/14/19 0455 07/15/19 0425 07/16/19 0548  DDIMER  --   --  2.65* 2.31* 2.13* 1.95* 2.81*  --   FERRITIN  --   --  309* 535* 510* 466* 518*  --   CRP  --   --   31.0* 27.9* 19.8* 15.2* 13.2*  --   ALT   < >  --   --  17 22 30  38 35  PROCALCITON  --  5.74  --  2.91 2.28  --   --   --    < > = values in this interval not displayed.    Positive COVID-19 results available in epic from 07/10/2019.  Objective findings: Fever: Remains afebrile.  Noted to be hypothermic yesterday.  Warming blankets applied. Oxygen requirements: Patient seems to be alternating between high flow nasal cannula and nonrebreather.     COVID 19 Therapeutics: Antibacterials: Completed 5-day course of ceftriaxone and azithromycin Remdesivir: Completed course on 12/13 Steroids: Dexamethasone 6 mg every 12 hours Diuretics: Not on diuretics on a scheduled basis Actemra: Not given yet Convalescent Plasma: Given on 12/12 Vitamin C and Zinc: Continue PUD Prophylaxis: Pepcid DVT Prophylaxis:  Lovenox   From a respiratory standpoint patient remained stable.  Unclear why she is being changed over to nonrebreather at times.  Inflammatory markers have improved.  She has completed course of remdesivir.  She remains on IV steroids.  Mainly because she is unable to be transition to oral steroids due to poor oral intake.  She has completed 5-day course of antibacterials as well.  Prognosis remains poor due to patient's underlying medical problems including dementia and poor baseline quality  of life.  Reason for hypothermia not entirely clear.  Warming blankets were applied.  Acute metabolic encephalopathy According to one daughter patient is able to get up and walk some and does talk at times.  But another daughter mentioned that she does not.  Information is conflicting.  She has not done much here in the hospital.  She is not responsive at all.  Prognosis is thought to be poor since patient has not shown much improvement despite 5 to 6 days of treatment.  Palliative medicine is following.  Hyperglycemia Blood work shows increased glucose levels.  Most likely due to steroids.  Initiate  SSI.  Should improve as steroid is tapered down.  No known history of diabetes.  Small pulsatile lump in the right neck base due to tortuous common carotid artery About 2 cm lump noted in the right neck base.  It is noted to be pulsatile.  No bruising noted.  Doppler suggests tortuosity of the common carotid artery.  This is a likely explanation for the lump.  No further work-up at this time.  .  Hypernatremia and hypokalemia Sodium level has improved to 148 today.  She was started on D5 water few days ago.  Continue for now.  Oral intake very poor mainly because of encephalopathy and unresponsiveness.  She is unsafe for diet at this time.  Magnesium 2.1 today.  Potassium 3.9.     History of Alzheimer's dementia Appears to be quite advanced.  Patient noted to be on Aricept [name] and Seroquel at home.  Seroquel is on hold currently.  History of seizure disorder No seizure type activity noted.  She is on intravenous Keppra due to poor oral intake.    Goals of care Patient with a poor quality of life at baseline.  She is thought to have poor prognosis because she has not really had anything to eat or drink the last several days.  Has not shown much improvement.  Electrolytes have improved.  We can continue to watch for additional 1 to 2 days to see if she improves any.  However family has been in sort of denial.  They are still expecting that the patient will improve.  Palliative medicine is also following.  Plan is for patient to go to home with hospice.  But family wants patient to improve some before they will accept her home. Wait for a little bit more stability before discharge.    ADDENDUM Spoke to patient's daughters.  They are still uncertain about what they want to do.  They still seem to think that the patient is going to improve and recover.  They are asking about feeding tubes etc.  I think at this point in time I will like to [facility] palliative medicine to have further goals of care  conversation with the patient's daughters.  DVT Prophylaxis: Lovenox Code Status: DNR Family Communication: Daughter being updated daily. Disposition Plan: Plan is for discharge home with hospice services when patient is more stable.    Medications:  Scheduled: . dexamethasone (DECADRON) injection  6 mg Intravenous Q24H  . donepezil  5 mg Oral QHS  . enoxaparin (LOVENOX) injection  30 mg Subcutaneous Q24H  . Ipratropium-Albuterol  1 puff Inhalation Q6H  . memantine  28 mg Oral QHS  . vitamin C  500 mg Oral Daily  . zinc sulfate  220 mg Oral Daily   Continuous: . dextrose 5 % with KCl 20 mEq / L 75 mL/hr at 07/16/19 0700  .  famotidine (PEPCID) IV Stopped (07/15/19 1557)  . levETIRAcetam 500 mg (07/16/19 0950)   UEA:VWUJWJXBJYNWG, acetaminophen, chlorpheniramine-HYDROcodone, guaiFENesin-dextromethorphan, hydrALAZINE, hydrALAZINE, ondansetron **OR** ondansetron (ZOFRAN) IV, senna-docusate, simethicone   Objective:  Vital Signs  Vitals:   07/16/19 0546 07/16/19 0713 07/16/19 0800 07/16/19 0936  BP:  114/61 115/71 115/66  Pulse:  75 73 69  Resp:  (!) 21 (!) 23 (!) 23  Temp: 97.6 F (36.4 C) 98.3 F (36.8 C)    TempSrc: Temporal Temporal    SpO2:  98%  91%  Weight:      Height:        Intake/Output Summary (Last 24 hours) at 07/16/2019 1126 Last data filed at 07/16/2019 0700 Gross per 24 hour  Intake 2191.56 ml  Output 170 ml  Net 2021.56 ml   Filed Weights   07/10/19 1704 07/11/19 0443  Weight: 43.1 kg 43 kg    General appearance: Remains unresponsive Resp: Coarse breath sounds.  No wheezing or rhonchi.  Few crackles at the bases. Cardio: S1-S2 is normal regular.  No S3-S4.  No rubs murmurs or bruit GI: Abdomen is soft.  Nontender nondistended.  Bowel sounds are present normal.  No masses organomegaly Neurologic: Not responsive.    Lab Results:  Data Reviewed: I have personally reviewed following labs and imaging studies  CBC: Recent Labs  Lab  07/10/19 1548 07/12/19 0450 07/13/19 0126 07/14/19 0455 07/15/19 0425  WBC 4.2 5.4 8.1 12.4* 13.1*  NEUTROABS 3.6 4.7 7.4 11.9* 12.4*  HGB 12.9 14.7 14.5 13.7 14.0  HCT 40.5 44.2 43.4 41.9 43.6  MCV 99.5 95.3 93.1 95.7 98.9  PLT 110* 121* 167 178 228    Basic Metabolic Panel: Recent Labs  Lab 07/12/19 0450 07/13/19 0126 07/14/19 0455 07/15/19 0425 07/16/19 0548  NA 148* 150* 155* 153* 148*  K 4.0 3.1* 3.1* 3.7 3.9  CL 108 110 115* 114* 112*  CO2 GLUCOSE 119* 159* 179* 306* 403*  BUN 24* 26* 33* 34* 41*  CREATININE 0.47 0.54 0.52 0.67 0.84  CALCIUM 8.8* 8.5* 8.3* 8.5* 8.4*  MG 1.7  --  1.6*  --  2.1    GFR: Estimated Creatinine Clearance: 33.8 mL/min (by C-G formula based on SCr of 0.84 mg/dL).  Liver Function Tests: Recent Labs  Lab 07/12/19 0450 07/13/19 0126 07/14/19 0455 07/15/19 0425 07/16/19 0548  AST 34 51* 76* 85* 49*  ALT 38 35  ALKPHOS 37* 40 51 58 59  BILITOT 1.4* 1.7* 1.3* 1.4* 1.7*  PROT 6.8 6.8 6.2* 6.4* 6.2*  ALBUMIN 2.8* 3.0* 2.7* 2.8* 2.7*     Coagulation Profile: Recent Labs  Lab 07/10/19 1548  INR 1.0     Anemia Panel: Recent Labs    07/14/19 0455 07/15/19 0425  FERRITIN 466* 518*    Recent Results (from the past 240 hour(s))  Blood Culture (routine x 2)     Status: None   Collection Time: 07/10/19  3:48 PM   Specimen: BLOOD LEFT FOREARM  Result Value Ref Range Status   Specimen Description   Final    BLOOD LEFT FOREARM Performed at [facility], [address], [place], [place] [address]    Special Requests   Final    BOTTLES DRAWN AEROBIC ONLY Blood Culture results may not be optimal due to an inadequate volume of blood received in culture bottles Performed at [facility], [address] [place], [place] [address]  Culture   Final    NO GROWTH 5 DAYS Performed at [facility], [address], [place], [place] [address]    Report Status  07/15/2019 FINAL  Final  Urine culture     Status: None   Collection Time: 07/10/19  4:31 PM   Specimen: In/Out Cath Urine  Result Value Ref Range Status   Specimen Description   Final    IN/OUT CATH URINE Performed at [facility], [address] [place], [place] [address]    Special Requests   Final    NONE Performed at [facility], [address] [place], [place] [address]    Culture   Final    NO GROWTH Performed at [facility], [address], [place], [place] [address]    Report Status 07/11/2019 FINAL  Final      Radiology Studies: No results found.     LOS: 6 days   Shalaine Payson [employer] on [email or web address]  07/16/2019, 11:26 AM

## 2019-07-16 NOTE — Progress Notes (Addendum)
Pt NRB found at 5L. Pt taken off NRB and on 6L HFNC at this time.

## 2019-07-16 NOTE — Progress Notes (Signed)
Manufacturing engineer (ACC)  DME has been delivered to home.  Updated TOC manager.  Please arrange for any medications that may be needed for comfort until hospice is able to begin services.  Venia Carbon RN, BSN, [facility] Liaison  [phone]

## 2019-07-16 NOTE — Progress Notes (Signed)
[facility]  DME ordered yesterday, noted increase in O2 demand overnight.  Additional O2 will need to be ordered prior to her d/c.  Updated TOC manager.  Venia Carbon RN, BSN, [facility] Liaison  [phone]

## 2019-07-16 NOTE — Progress Notes (Signed)
Daughter updated 

## 2019-07-17 LAB — BASIC METABOLIC PANEL
Anion gap: 8 (ref 5–15)
BUN: 52 mg/dL — ABNORMAL HIGH (ref 8–23)
CO2: 28 mmol/L (ref 22–32)
Calcium: 8.6 mg/dL — ABNORMAL LOW (ref 8.9–10.3)
Chloride: 117 mmol/L — ABNORMAL HIGH (ref 98–111)
Creatinine, Ser: 1.09 mg/dL — ABNORMAL HIGH (ref 0.44–1.00)
GFR calc Af Amer: 54 mL/min — ABNORMAL LOW (ref >60)
GFR calc non Af Amer: 47 mL/min — ABNORMAL LOW (ref >60)
Glucose, Bld: 123 mg/dL — ABNORMAL HIGH (ref 70–99)
Potassium: 4.4 mmol/L (ref 3.5–5.1)
Sodium: 153 mmol/L — ABNORMAL HIGH (ref 135–145)

## 2019-07-17 LAB — GLUCOSE, CAPILLARY
Glucose-Capillary: 101 mg/dL — ABNORMAL HIGH (ref 70–99)
Glucose-Capillary: 124 mg/dL — ABNORMAL HIGH (ref 70–99)
Glucose-Capillary: 76 mg/dL (ref 70–99)

## 2019-07-17 MED ORDER — ACETAMINOPHEN 325 MG PO TABS: 650.0000 mg | ORAL_TABLET | Freq: Four times a day (QID) | ORAL | Status: AC | PRN

## 2019-07-17 MED ORDER — MORPHINE SULFATE (CONCENTRATE) 10 MG/0.5ML PO SOLN: 10.0000 mg | ORAL | Status: DC | PRN

## 2019-07-17 MED ORDER — MORPHINE SULFATE (CONCENTRATE) 10 MG/0.5ML PO SOLN: 5.0000 mg | ORAL | 0 refills | Status: AC | PRN

## 2019-07-17 MED ORDER — IPRATROPIUM-ALBUTEROL 20-100 MCG/ACT IN AERS
1.0000 | INHALATION_SPRAY | Freq: Four times a day (QID) | RESPIRATORY_TRACT | Status: DC | PRN
  Filled 2019-07-17: qty 4

## 2019-07-17 NOTE — Discharge Instructions (Signed)
Date of Positive COVID Test: 07/10/2019  Date Quarantine Ends: 07/31/2019  Franco Nones de la prueba COVID positiva: 04/13/2019  Franco Nones de finalizacin de la cuarentena: 30/07/2019  COVID-19 La COVID-19 es una infeccin respiratoria causada por un virus llamado coronavirus tipo 2 causante del sndrome respiratorio agudo grave (SARS-CoV-2). La enfermedad tambin se conoce como enfermedad por coronavirus o nuevo coronavirus. En algunas personas, el virus puede no ocasionar sntomas. En otras, puede producir una infeccin grave. La infeccin puede empeorar rpidamente y causar complicaciones, como:  Neumona o infeccin en los pulmones.  Sndrome de dificultad respiratoria aguda o SDRA. Se trata de la acumulacin de lquido en los pulmones.  Insuficiencia respiratoria aguda. Se trata de una afeccin en la que no pasa suficiente oxgeno de los pulmones al cuerpo.  Sepsis o choque sptico. Se trata de una reaccin grave del cuerpo ante una infeccin.  Problemas de coagulacin.  Infecciones secundarias debido a bacterias u hongos. El virus que causa la COVID-19 es contagioso. Esto significa que puede transmitirse de [place] persona a otra a travs de las gotitas de saliva de la tos y de los estornudos (secreciones respiratorias). Cules son las causas? Esta enfermedad es causada por un virus. Usted puede contagiarse con este virus:  Al aspirar las gotitas que una persona infectada elimina al toser o Engineering geologist.  Al tocar algo, como una mesa o el picaportes de [place], que estuvo expuesto al virus (contaminado) y luego tocarse la boca, nariz o los ojos. Qu incrementa el riesgo? Riesgo de infeccin Es ms probable que se infecte con este virus si:  Vive o viaja a una zona donde hay un brote de COVID-19.  Carollee Massed en contacto con una persona enferma que recientemente viaj a una zona con un brote de COVID-19.  Cuida o vive con una persona infectada con COVID-19. Riesgo de enfermedad grave Es  ms probable que se enferme gravemente por el virus si:  Tiene 67aos o ms.  Tiene una enfermedad crnica que disminuye la capacidad del cuerpo para combatir las infecciones (immunocomprometido).  Vive en un hogar de ancianos o centro de atencin a Air cabin crew.  Tiene una enfermedad prolongada (crnica), como las siguientes: ? Enfermedad pulmonar crnica, que incluye la enfermedad pulmonar obstructiva crnica o asma. ? Enfermedad cardaca. ? Diabetes. ? Enfermedad renal crnica. ? Enfermedad heptica.  Es obeso. Cules son los signos o sntomas? Los sntomas de esta afeccin pueden ser de leves a graves. Los sntomas pueden aparecer en el trmino de 2 a [address] despus de haber estado expuesto al virus. Incluyen los siguientes:  [place].  Tos.  Dificultad para respirar.  Escalofros.  Dolores musculares.  Dolor de Advertising copywriter.  Prdida del gusto o Cabin crew. Algunas personas tambin pueden Mattel, como nuseas, vmitos o diarrea. Es posible que otras personas no tengan sntomas de COVID-19. Cmo se diagnostica? Esta afeccin se puede diagnosticar en funcin de lo siguiente:  Sus signos y sntomas, especialmente si: ? Vive en una zona donde hay un brote de COVID-19. ? Viaj recientemente a una zona donde el virus es frecuente. ? Cuida o vive con Neomia Dear persona a quien se le diagnostic COVID-19.  Un examen fsico.  Anlisis de laboratorio que pueden incluir: ? Un hisopado nasal para tomar [place] de lquido de la nariz. ? Un hisopado de garganta para tomar Lauris Poag de lquido de la garganta. ? Una muestra de mucosidad de los pulmones (esputo). ? Anlisis de [place].  Los estudios de diagnstico por imgenes pueden  incluir radiografas, exploracin por tomografa computarizada (TC) o ecografa. Cmo se trata? En este momento, no hay ningn medicamento para tratar la COVID-19. Los medicamentos para tratar otras enfermedades se usan a modo de  ensayo para comprobar si son eficaces contra la COVID-19. El mdico le informar sobre las maneras de tratar los sntomas. En la [employer], la infeccin es leve y puede controlarse en el hogar con reposo, lquidos y medicamentos de [place]. El tratamiento para una infeccin grave suele realizarse en la unidad de cuidados intensivos (UCI) de un hospital. Puede incluir uno o ms de los siguientes. Estos tratamientos se administran hasta que los sntomas mejoran.  Recibir lquidos y [employer]ne[employer] a travs de una va intravenosa.  Oxgeno complementario. Para administrar oxgeno extra, se [place] un tubo en la Doran Dur[place], una mascarilla o una campana de oxgeno.  Colocarlo para que se recueste boca abajo (decbito prono). Esto facilita el ingreso de oxgeno a los pulmones.  Uso continuo de [place] de presin positiva de las vas areas (CPAP) o de presin positiva de las vas areas de dos niveles (BPAP). Este tratamiento utiliza una presin de aire leve para Theatre manager las vas respiratorias abiertas. Un tubo conectado a un motor administra oxgeno al cuerpo.  Respirador. Este tratamiento mueve el aire dentro y fuera de los pulmones mediante el uso de un tubo que se coloca en la trquea.  Traqueostoma. En este procedimiento se hace un orificio en el cuello para insertar un tubo de respiracin.  Oxigenacin por membrana extracorprea (OMEC). En este procedimiento, los pulmones tienen la posibilidad de recuperarse al asumir las funciones del corazn y los pulmones. Suministra oxgeno al cuerpo y elimina el dixido de carbono. Siga estas instrucciones en su casa: Estilo de vida  Si est enfermo, qudese en su casa, excepto para obtener atencin mdica. El mdico le indicar cunto tiempo debe quedarse en casa. Llame al mdico antes de buscar atencin mdica.  Haga reposo en su casa como se lo haya indicado el mdico.  No consuma ningn producto que contenga nicotina o tabaco, como  cigarrillos, cigarrillos electrnicos y tabaco de Higher education careers adviser. Si necesita ayuda para dejar de fumar, consulte al mdico.  Retome sus actividades normales como se lo haya indicado el mdico. Pregntele al mdico qu actividades son seguras para usted. Instrucciones generales  Use los medicamentos de venta libre y los recetados solamente como se lo haya indicado el mdico.  Beba suficiente lquido como para Theatre manager la orina de color amarillo plido.  Concurra a todas las visitas de seguimiento como se lo haya indicado el mdico. Esto es importante. Cmo se evita?  No hay ninguna vacuna que ayude a prevenir la infeccin por la COVID-19. Sin embargo, hay medidas que puede tomar para protegerse y Museum/gallery curator a Producer, television/film/video de este virus. Para protegerse:   No viaje a zonas donde la COVID-19 sea un riesgo. Las zonas donde se informa la presencia de la COVID-19 [place] con frecuencia. Para identificar las zonas de alto riesgo y las restricciones de viaje, consulte el sitio web de viajes de Garment/textile technologist for [employer] and Prevention Librarian, academic) (Centros para el Control y la Prevencin de Arboriculturist): FatFares.com.br  Si vive o debe viajar a una zona donde COVID-19 es un riesgo, tome precauciones para evitar infecciones. ? Aljese de [place]. ? Lvese las manos frecuentemente con agua y [place]. Use desinfectante para manos con alcohol si no dispone de [place] y [place]. ? Evite tocarse la boca, la  cara, los ojos o la Clinical cytogeneticistnariz. ? Evite salir de su casa, siga las indicaciones de su estado y de las autoridades sanitarias locales. ? Si debe salir de su casa, use un barbijo de tela o una mascarilla facial. ? Desinfecte los objetos y las superficies que se tocan con frecuencia todos [place]los das. Pueden incluir:  Encimeras y Dearingmesas.  Picaportes e interruptores de luz.  Lavabos, fregaderos y grifos.  Aparatos electrnicos tales como telfonos, controles remotos, teclados,  computadoras y tabletas. Cmo proteger a los dems: Si tiene sntomas de la COVID-19, tome medidas para evitar que el virus se propague a Economistotras personas.  Si cree que tiene una infeccin por la COVID-19, comunquese de inmediato con su mdico. Informe al equipo de atencin mdica que cree que puede tener una infeccin por la COVID-19.  Qudese en su casa. Salga de su casa solo para buscar atencin mdica. No utilice el transporte pblico.  No viaje mientras est enfermo.  Lvese las manos frecuentemente con agua y [place]n durante 20segundos. Usar desinfectante para manos con alcohol si no dispone de [place] y [place]jabn.  Mantngase alejado de quienes vivan con usted. Permita que los miembros de la familia sanos cuiden a los nios y las [place] Citymascotas, si es posible. Si tiene que cuidar a los nios o las mascotas, lvese las manos con frecuencia y use un barbijo. Si es posible, permanezca en su habitacin, separado de los dems. Utilice un bao diferente.  Asegrese de que todas las personas que viven en su casa se laven bien las manos y con frecuencia.  Tosa o estornude en un pauelo de papel o sobre su manga o codo. No tosa o estornude al aire ni se cubra la boca o la nariz con la Schultermano.  Use un barbijo de tela o una mascarilla facial. Dnde buscar ms informacin  Centers for Disease Control and Prevention (Centros para el Control y la Prevencin de Event organisernfermedades): StickerEmporium.tnwww.cdc.gov/coronavirus/2019-ncov/index.html  World Health Organization (Organizacin Mundial de la Salud): https://thompson-craig.com/www.who.int/health-topics/coronavirus Comunquese con un mdico si:  Vive o ha viajado a una zona donde la COVID-19 es un riesgo y tiene sntomas de infeccin.  Ha tenido contacto con alguien que tiene COVID-19 y usted tiene sntomas de infeccin. Solicite ayuda de inmediato si:  Tiene dificultad para respirar.  Siente dolor u opresin en el pecho.  Experimenta confusin.  Tiene las uas de los dedos y los labios de color  Plymouthazulado.  Tiene dificultad para despertarse.  Los sntomas empeoran. Estos sntomas pueden representar un problema grave que constituye Radio broadcast assistantuna emergencia. No espere a ver si los sntomas desaparecen. Solicite atencin mdica de inmediato. Comunquese con el servicio de emergencias de su localidad (911 en los [place]). No conduzca por sus propios medios Dollar Generalhasta el hospital. Informe al personal mdico de emergencias si cree que tiene COVID-19. Resumen  La COVID-19 es una infeccin respiratoria causada por un virus. Tambin se conoce como enfermedad por coronavirus o nuevo coronavirus. Puede causar infecciones graves, como neumona, sndrome de dificultad respiratoria aguda, insuficiencia respiratoria aguda o sepsis.  El virus que causa la COVID-19 es contagioso. Esto significa que puede transmitirse de [place] persona a otra a travs de las gotitas de saliva de la tos y de los estornudos.  Es ms probable que desarrolle una enfermedad grave si tiene 65 aos o ms, tiene un sistema inmunitario dbil, vive en un hogar de ancianos o tiene enfermedad crnica.  No hay ningn medicamento para tratar la COVID-19. El mdico le informar sobre las Lambs Grovemaneras  de tratar los sntomas.  Tome medidas para protegerse y Conservator, museum/gallery a los Merchandiser, retail las infecciones. Lvese las manos con frecuencia y desinfecte los objetos y las superficies que se tocan con frecuencia todos Mayer. Mantngase alejado de las personas que estn enfermas y use un barbijo si est enfermo. Esta informacin no tiene Theme park manager el consejo del mdico. Asegrese de hacerle al mdico cualquier pregunta que tenga. Document Released: 09/15/2018 Document Revised: 12/18/2018 Document Reviewed: 09/15/2018 Elsevier Patient Education  2020 Ar[employer].   Cuidados paliativos Hospice Los cuidados paliativos son un servicio diseado para brindar apoyo mdico, espiritual y psicolgico a los enfermos terminales y a Secondary school teacher. El objetivo es  mejorar su calidad de vida mantenindolo tan cmodo como sea posible en las etapas finales de la vida. Quines formarn parte de mi equipo de atencin cuando [facility] cuidados paliativos? Generalmente, los equipos de cuidados paliativos incluyen:  Un enfermero.  Un mdico. El mdico de cuidados paliativos estar disponible para atenderlo, pero su mdico o enfermero habituales pueden tambin seguir atendindolo.  Un trabajador social.  Un consejero.  Un lder religioso (como un capelln).  Un nutricionista.  Un terapeuta.  Voluntarios capacitados que pueden ayudar con su atencin. Qu servicios se incluyen en los cuidados paliativos? Los servicios de cuidados paliativos pueden variar segn el centro u organizacin. Generalmente, estos incluyen:  Maneras de [place] cmodo, tales como: ? Office manager atencin en su casa o en un entorno hogareo. ? Trabajar con su familia y sus amigos para ayudar a satisfacer sus necesidades. ? Permitirle disfrutar del apoyo de sus seres queridos al recibir gran parte de la atencin bsica de sus familiares y Personnel officer.  Gestin del alivio del dolor y los sntomas. El Catering manager todos los medicamentos y el equipo que sean necesarios para que pueda estar cmodo y lo suficientemente alerta como para disfrutar de la compaa de sus amigos y familiares.  Visitas o atencin de un enfermero y un mdico. Esto puede incluir servicios de guardia las 24 horas.  Compaa cuando est solo.  Permitirles a usted y a los [address] de su familia que descansen. Los [employer] Electric del personal de cuidados paliativos pueden Education officer, environmental los [employer], preparar las comidas y ocuparse de las obligaciones diarias.  Psicoterapia. Se asegurarn de que se satisfagan sus necesidades emocionales, espirituales y [place], as como tambin las de sus familiares.  Atencin espiritual. Esta ser personalizada para satisfacer sus necesidades y las de [place].  Este puede incluir lo siguiente: ? Ayudarlos a usted y a su familia a Photographer de la [place]. ? Ayudarlo a despedirse de sus familiares y amigos. ? Llevar a cabo una ceremonia o un ritual religioso especfico.  Masajes.  Terapia nutricional.  Fisioterapia y terapia ocupacional.  Atencin hospitalaria a corto plazo, si algo no se puede Buyer, retail.  Arte o terapia musical.  Apoyo por duelo para familiares en proceso de duelo. Cundo deben Wells [employer] cuidados paliativos? Se cree que la Harley-Davidson de las personas que First Data Corporation cuidados paliativos tiene menos de de vida.  Su familia y los mdicos pueden ayudarlo a decidir cundo Sports coach los servicios de cuidados paliativos.  Si vive ms de 6 meses, pero su afeccin no mejora, es posible que su mdico lo apruebe para continuar recibiendo cuidados paliativos.  Si su cuadro clnico mejora, puede suspender el [place]. Qu cosas debo tener en cuenta antes de elegir un programa? La Harley-Davidson de los programas de cuidados  paliativos estn a cargo de organizaciones independientes sin fines de lucro. Algunas estn afiliadas a hospitales, hogares de ancianos o agencias de atencin Scientist, research (life sciences). Los programas de cuidados paliativos pueden implementarse en su hogar o en un centro de cuidados paliativos, un hospital o un centro especializado de enfermera. Cuando elija un programa de cuidados paliativos, haga las siguientes preguntas:  Qu servicios tengo a mi disposicin?  Qu servicios se ofrecern a mis seres queridos?  Qu participacin tendrn mis seres queridos?  Qu participacin tendr mi mdico?  Quines integran el equipo de cuidados paliativos? Cmo se los capacita o selecciona?  Cmo se controlarn el dolor y los sntomas?  Si mi situacin cambia, los servicios pueden brindarse en un entorno diferente, por ejemplo, en mi casa o en el hospital?  [place], revisa y [place], o este tiene algn otro tipo de certificacin?  Cunto cuesta? Est cubierto por el seguro mdico?  Si elijo internarme en un centro de cuidados paliativos o centro de enfermera, dnde est ubicado el centro de cuidados paliativos? Es conveniente para mis familiares y amigos?  Si elijo internarme en un centro de cuidados paliativos o centro de enfermera, pueden mis familiares y amigos visitarme en cualquier momento?  Proporcionarn apoyo emocional y espiritual?  A quin pueden llamar mis familiares si tienen preguntas? Dnde puedo obtener ms informacin acerca de los cuidados paliativos? Los mdicos pueden brindarle informacin acerca de los programas de cuidados paliativos que existen en su zona. Adems, puede leer al [employer]. Los sitios web de las siguientes organizaciones tienen informacin til:  Nurse, learning disability de Hospicios y Scientist, clinical (histocompatibility and immunogenetics) [facility], [place]): SubReactor.pl  Asociacin Nacional de Atencin Domiciliaria y IT trainer (Geologist, engineering for Home Care & Hospice, [facility]): PortableGrid.se  Fundacin Estadounidense de Cuidados Paliativos (Hospice [place] of [place], [place]): www.hospicefoundation.org  Sociedad Estadounidense del Database administrator (American Cancer Society, ACS): www.cancer.org  Hospice Net: www.hospicenet.org  Asociacin Estadounidense de Theatre manager Armed forces technical officer Associations of [place], VNAA): www.vnaa.org Tambin puede encontrar ms informacin ponindose en contacto con las siguientes agencias:  Una agencia local sobre el envejecimiento.  Su oficina local de [employer]ning.  El departamento de salud o servicios sociales de su [place]. Resumen  Los cuidados paliativos son un servicio diseado para brindar apoyo mdico, espiritual y psicolgico a los enfermos terminales y a Secondary school teacher.  El objetivo de los cuidados paliativos es mejorar su calidad de vida  mantenindolo tan cmodo como sea posible en las etapas finales de la vida.  Generalmente, los equipos de cuidados paliativos incluyen un mdico, enfermero, Radio broadcast assistant social, consejero, lder religioso, nutricionista, terapeutas y voluntarios.  Los cuidados paliativos generalmente incluyen medicamentos para la gestin de los sntomas, visitas de mdicos y enfermeros, terapia fsica y ocupacional, asesoramiento nutricional, asesoramiento espiritual y Animator, apoyo de cuidadores y apoyo por duelo para los familiares en proceso de duelo.  Los programas de cuidados paliativos pueden implementarse en su hogar o en un centro de cuidados paliativos, un hospital o un centro especializado de enfermera. Esta informacin no tiene Theme park manager el consejo del mdico. Asegrese de hacerle al mdico cualquier pregunta que tenga. Document Released: 11/03/2008 Document Revised: 03/13/2017 Document Reviewed: 05/28/2013 Elsevier Patient Education  2020 ArvinMeritor.   Cuidados paliativos Hospice Los cuidados paliativos son un servicio diseado para brindar apoyo mdico, espiritual y psicolgico a los enfermos terminales y a Secondary school teacher. El objetivo es mejorar su calidad de vida mantenindolo tan cmodo como sea  posible en las etapas finales de la vida. Quines formarn parte de mi equipo de atencin cuando [facility] cuidados paliativos? Generalmente, los equipos de cuidados paliativos incluyen:  Un enfermero.  Un mdico. El mdico de cuidados paliativos estar disponible para atenderlo, pero su mdico o enfermero habituales pueden tambin seguir atendindolo.  Un trabajador social.  Un consejero.  Un lder religioso (como un capelln).  Un nutricionista.  Un terapeuta.  Voluntarios capacitados que pueden ayudar con su atencin. Qu servicios se incluyen en los cuidados paliativos? Los servicios de cuidados paliativos pueden variar segn el centro u organizacin. Generalmente, estos  incluyen:  Maneras de [place] cmodo, tales como: ? Office manager atencin en su casa o en un entorno hogareo. ? Trabajar con su familia y sus amigos para ayudar a satisfacer sus necesidades. ? Permitirle disfrutar del apoyo de sus seres queridos al recibir gran parte de la atencin bsica de sus familiares y Personnel officer.  Gestin del alivio del dolor y los sntomas. El Catering manager todos los medicamentos y el equipo que sean necesarios para que pueda estar cmodo y lo suficientemente alerta como para disfrutar de la compaa de sus amigos y familiares.  Visitas o atencin de un enfermero y un mdico. Esto puede incluir servicios de guardia las 24 horas.  Compaa cuando est solo.  Permitirles a usted y a los [address] de su familia que descansen. Los Graybar Electric del personal de cuidados paliativos pueden Education officer, environmental los H[employer]R Block, preparar las comidas y ocuparse de las obligaciones diarias.  Psicoterapia. Se asegurarn de que se satisfagan sus necesidades emocionales, espirituales y [place], as como tambin las de sus familiares.  Atencin espiritual. Esta ser personalizada para satisfacer sus necesidades y las de [place]. Este puede incluir lo siguiente: ? Ayudarlos a usted y a su familia a Photographer de la [place]. ? Ayudarlo a despedirse de sus familiares y amigos. ? Llevar a cabo una ceremonia o un ritual religioso especfico.  Masajes.  Terapia nutricional.  Fisioterapia y terapia ocupacional.  Atencin hospitalaria a corto plazo, si algo no se puede Buyer, retail.  Arte o terapia musical.  Apoyo por duelo para familiares en proceso de duelo. Cundo deben Wells Fargo cuidados paliativos? Se cree que la Harley-Davidson de las personas que First Data Corporation cuidados paliativos tiene menos de de vida.  Su familia y los mdicos pueden ayudarlo a decidir cundo Sports coach los servicios de cuidados paliativos.  Si vive ms de 6 meses, pero su  afeccin no mejora, es posible que su mdico lo apruebe para continuar recibiendo cuidados paliativos.  Si su cuadro clnico mejora, puede suspender el [place]. Qu cosas debo tener en cuenta antes de elegir un programa? La mayora de los programas de cuidados paliativos estn a cargo de organizaciones independientes sin fines de Visual merchandiser. Algunas estn afiliadas a hospitales, hogares de ancianos o agencias de atencin Scientist, research (life sciences). Los programas de cuidados paliativos pueden implementarse en su hogar o en un centro de cuidados paliativos, un hospital o un centro especializado de enfermera. Cuando elija un programa de cuidados paliativos, haga las siguientes preguntas:  Qu servicios tengo a mi disposicin?  Qu servicios se ofrecern a mis seres queridos?  Qu participacin tendrn mis seres queridos?  Qu participacin tendr mi mdico?  Quines integran el equipo de cuidados paliativos? Cmo se los capacita o selecciona?  Cmo se controlarn el dolor y los sntomas?  Si mi situacin cambia, los servicios pueden brindarse en un entorno [place],  por ejemplo, en mi casa o en el hospital?  [place], revisa y [place], o este tiene algn otro tipo de certificacin?  Cunto cuesta? Est cubierto por el seguro mdico?  Si elijo internarme en un centro de cuidados paliativos o centro de enfermera, dnde est ubicado el centro de cuidados paliativos? Es conveniente para mis familiares y amigos?  Si elijo internarme en un centro de cuidados paliativos o centro de enfermera, pueden mis familiares y amigos visitarme en cualquier momento?  Proporcionarn apoyo emocional y espiritual?  A quin pueden llamar mis familiares si tienen preguntas? Dnde puedo obtener ms informacin acerca de los cuidados paliativos? Los mdicos pueden brindarle informacin acerca de los programas de cuidados paliativos que existen en su zona. Adems, puede leer al [employer]. Los sitios web de las siguientes organizaciones tienen informacin til:  Nurse, learning disability de Hospicios y Scientist, clinical (histocompatibility and immunogenetics) [facility] and Palliative Care Organization, [place]): SubReactor.pl  Asociacin Nacional de Atencin Domiciliaria y IT trainer (Geologist, engineering for Home Care & Hospice, [facility]): PortableGrid.se  Fundacin Estadounidense de Cuidados Paliativos (Hospice [place] of [place], [place]): www.hospicefoundation.org  Sociedad Estadounidense del Database administrator (American Cancer Society, ACS): www.cancer.org  Hospice Net: www.hospicenet.org  Asociacin Estadounidense de Theatre manager Armed forces technical officer [employer] [place], VNAA): www.vnaa.org Tambin puede encontrar ms informacin ponindose en contacto con las siguientes agencias:  Una agencia local sobre el envejecimiento.  Su oficina local de [employer].  El departamento de salud o servicios sociales de su [place]. Resumen  Los cuidados paliativos son un servicio diseado para brindar apoyo mdico, espiritual y psicolgico a los enfermos terminales y a Secondary school teacher.  El objetivo de los cuidados paliativos es mejorar su calidad de vida mantenindolo tan cmodo como sea posible en las etapas finales de la vida.  Generalmente, los equipos de cuidados paliativos incluyen un mdico, enfermero, Radio broadcast assistant social, consejero, lder religioso, nutricionista, terapeutas y voluntarios.  Los cuidados paliativos generalmente incluyen medicamentos para la gestin de los sntomas, visitas de mdicos y enfermeros, terapia fsica y ocupacional, asesoramiento nutricional, asesoramiento espiritual y Animator, apoyo de cuidadores y apoyo por duelo para los familiares en proceso de duelo.  Los programas de cuidados paliativos pueden implementarse en su hogar o en un centro de cuidados paliativos, un hospital o un centro especializado de enfermera. Esta informacin no tiene Theme park manager el consejo del  mdico. Asegrese de hacerle al mdico cualquier pregunta que tenga. Document Released: 11/03/2008 Document Revised: 03/13/2017 Document Reviewed: 05/28/2013 Elsevier Patient Education  2020 [employer].

## 2019-07-17 NOTE — Progress Notes (Signed)
MD paged regarding increased O2 needs.  From [facility] to 10 LNC with sats at 94%. Also decreasing pulse and BP. 60HR and BP 96/47 (62).  Patient is supposed to be discharged home with hospice tomorrow, but daughter has not had EOL visit.

## 2019-07-17 NOTE — Discharge Summary (Addendum)
DISCHARGE SUMMARY  ANALYSA NUTTING  MR#: [id number]  DOB:20-Feb-1933  Date of Admission: 07/10/2019 Date of Discharge: 07/26/19  Attending Physician: Hennie Duos, MD  Patient's VFI:EPPIR, Priscille Heidelberg., MD  Consults: Palliative Care   Disposition: d/c home w/ hospice   Date of Positive COVID Test: 07/10/2019  Date Quarantine Ends: 07/31/2019  Follow-up Appts: Follow-up Information     Arman Bogus., MD Follow up.   Specialty: Internal Medicine Why: as needed  Contact information: [place] [id number] [phone]            Discharge Diagnoses: COVID Pneumonia Acute hypoxic respiratory failure Acute metabolic encephalopathy Alzheimer's dementia Hyperglycemia Hypernatremia Hypokalemia Seizure disorder Underweight Comfort focused care -Home hospice NO CODE BLUE - DNR  Initial presentation: 85 year old with a history of Alzheimer's dementia and seizure disorder who presented to the ED with fever, cough, and worsening shortness of breath over a 2-day timeframe.  She was found to be Covid positive and hypoxic.  Hospital Course:  COVID Pneumonia  She has completed a course of remdesivir.  She was dosed with IV Decadron during her inpatient stay. She completed a 5-day course of antibacterials as well. Prognosis remains poor due to patient's underlying medical problems including dementia and poor baseline quality of life.   Acute metabolic encephalopathy Her exact baseline functional status is unclear / information is conflicting. She did not do much while in the hospital.  She was not responsive at all, with no improvement despite 5-6 days of medical care/treatment.     Hyperglycemia Most likely due to steroids. No known history of diabetes. Steroids discontinued at time of d/c.    Small pulsatile lump in the right neck base due to tortuous common carotid artery About 2 cm lump noted in the right neck base.  It was  noted to be pulsatile.  No bruising noted.  Doppler suggested tortuosity of the common carotid artery.  This is a likely explanation for the lump.    Hypernatremia and hypokalemia Resolved w/ tx in the hospital   Alzheimer's dementia Appears to be quite advanced.  Patient noted to be on Aricept [name] and Seroquel at home.     History of seizure disorder No seizure type activity noted during her hospital stay.    Underweight   Goals of care Patient with a perceived poor quality of life at baseline.  She is thought to have poor prognosis because she has not really had anything to eat or drink the last several days.  Has not shown much improvement.  Palliative medicine discussed w/ family and formulated plan for patient to go to home with hospice. It was made clear to family that patient is not expected to improve, and is coming home to die with her family.    Allergies as of 07-26-2019   No Known Allergies      Medication List     TAKE these medications    acetaminophen 325 MG tablet Commonly known as: TYLENOL Take 2 tablets (650 mg total) by mouth every 6 (six) hours as needed for mild pain or headache (fever >/= 101).   donepezil 5 MG tablet Commonly known as: ARICEPT Take 5 mg by mouth at bedtime.   levETIRAcetam 500 MG tablet Commonly known as: KEPPRA Take 500 mg by mouth 2 (two) times daily.   memantine 28 MG Cp24 24 hr capsule Commonly known as: [name] XR Take 28 mg by mouth at bedtime.   morphine CONCENTRATE 10 MG/0.5ML  Soln concentrated solution Take 0.25-0.5 mLs (5-10 mg total) by mouth every hour as needed for severe pain, anxiety or shortness of breath.   QUEtiapine 100 MG tablet Commonly known as: SEROQUEL Take 100 mg by mouth at bedtime.   simethicone 80 MG chewable tablet Commonly known as: Gas-X Chew 1 tablet (80 mg total) by mouth every 6 (six) hours as needed for flatulence.        Day of Discharge BP (!) 59/33 (BP Location: Left Arm)    Pulse (!) 50   Temp 97.9 F (36.6 C) (Axillary)   Resp 20   Ht 5' (1.524 m)   Wt 43 kg   SpO2 (!) 86%   BMI 18.51 kg/m    Basic Metabolic Panel: Recent Labs  Lab 07/12/19 0450 07/13/19 0126 07/14/19 0455 07/15/19 0425 07/16/19 0548 11-03-18 0010  NA 148* 150* 155* 153* 148* 153*  K 4.0 3.1* 3.1* 3.7 3.9 4.4  CL 108 110 115* 114* 112* 117*  CO2 22 23 25 27 24 28   GLUCOSE 119* 159* 179* 306* 403* 123*  BUN 24* 26* 33* 34* 41* 52*  CREATININE 0.47 0.54 0.52 0.67 0.84 1.09*  CALCIUM 8.8* 8.5* 8.3* 8.5* 8.4* 8.6*  MG 1.7  --  1.6*  --  2.1  --     Liver Function Tests: Recent Labs  Lab 07/12/19 0450 07/13/19 0126 07/14/19 0455 07/15/19 0425 07/16/19 0548  AST 34 51* 76* 85* 49*  ALT 17 22 30  38 35  ALKPHOS 37* 40 51 58 59  BILITOT 1.4* 1.7* 1.3* 1.4* 1.7*  PROT 6.8 6.8 6.2* 6.4* 6.2*  ALBUMIN 2.8* 3.0* 2.7* 2.8* 2.7*    Coags: Recent Labs  Lab 07/10/19 1548  INR 1.0    CBC: Recent Labs  Lab 07/10/19 1548 07/12/19 0450 07/13/19 0126 07/14/19 0455 07/15/19 0425  WBC 4.2 5.4 8.1 12.4* 13.1*  NEUTROABS 3.6 4.7 7.4 11.9* 12.4*  HGB 12.9 14.7 14.5 13.7 14.0  HCT 40.5 44.2 43.4 41.9 43.6  MCV 99.5 95.3 93.1 95.7 98.9  PLT 110* 121* 167 178 228   BNP (last 3 results) Recent Labs    07/10/19 1744  BNP 116.0*    CBG: Recent Labs  Lab 07/16/19 2026 07/16/19 2122 11-03-18 0024 11-03-18 0359 11-03-18 0827  GLUCAP 194* 176* 124* 101* 76    Recent Results (from the past 240 hour(s))  Blood Culture (routine x 2)     Status: None   Collection Time: 07/10/19  3:48 PM   Specimen: BLOOD LEFT FOREARM  Result Value Ref Range Status   Specimen Description   Final    BLOOD LEFT FOREARM Performed at [facility], [address] [place], [place] [address]    Special Requests   Final    BOTTLES DRAWN AEROBIC ONLY Blood Culture results may not be optimal due to an inadequate volume of blood received in culture bottles Performed at  [facility], [address] [place], [place] [address]    Culture   Final    NO GROWTH 5 DAYS Performed at [facility], [address] [place], [place] [address]    Report Status 07/15/2019 FINAL  Final  Urine culture     Status: None   Collection Time: 07/10/19  4:31 PM   Specimen: In/Out Cath Urine  Result Value Ref Range Status   Specimen Description   Final    IN/OUT CATH URINE Performed at [facility], [address] [place], [place] [address]    Special Requests   Final    NONE Performed at [facility], [address] [place], [place] [address]    Culture   Final    NO GROWTH Performed at [facility], [address], [place], [place] [address]    Report Status 07/11/2019 FINAL  Final     Time spent in discharge (includes decision making & examination of pt): 30 minutes  August 06, 2019, 9:34 AM   Lonia Blood, MD [facility] Office  [phone]

## 2019-07-17 NOTE — TOC Transition Note (Signed)
Transition of Care [facility]) - CM/SW Discharge Note   Patient Details  Name: April Lucero MRN: [id number] Date of Birth: [birth year]  [facility] CM/SW Contact:  Amador Cunas,  Phone Number: August 08, 2019, 9:06 AM   Clinical Narrative:   Pt for d/c home with hospice service provided by [facility]. Confirmed with dtr she is home and ready to receive pt. All DME delivered and Anderson Malta with [facility] confirmed they will admit pt to service today. SW signing off at d/c.   Wandra Feinstein, MSW, LCSW [phone] (GV coverage)       Final next level of care: Home w Hospice Care Barriers to Discharge: No Barriers Identified   Patient Goals and CMS Choice Patient states their goals for this hospitalization and ongoing recovery are:: to go home with hospice CMS Medicare.gov Compare Post Acute Care list provided to:: Patient Represenative (must comment) Choice offered to / list presented to : Adult Children  Discharge Placement                  Name of family member notified: [name] Patient and family notified of of transfer: 08-08-2019  Discharge Plan and Services   Discharge Planning Services: CM Consult Post Acute Care Choice: Hospice                               Social Determinants of Health (SDOH) Interventions     Readmission Risk Interventions No flowsheet data found.

## 2019-07-17 NOTE — Progress Notes (Signed)
MD called regarding increased O2 and decreasing BP. Patient on NRB with sats at 90% and BP consistently 80's/40's.

## 2019-07-17 NOTE — Progress Notes (Signed)
Was able to speak to daughter April Lucero and she was able to facetime with her mom. End of life visit was offered but daughter stated she takes care of her whole family and cannot get sick.  Daughter is aware of patient condition.

## 2019-07-17 NOTE — Progress Notes (Signed)
Spoke to daughter, Gwynneth Munson regarding deterioration of her mothers condition.  Daughter wants to speak to sister to see if they want to come to the hospital to see their mother.

## 2019-07-17 NOTE — Progress Notes (Signed)
Pt d/c via transport with [facility] for home hospice. IV removed. VS unsatable. Daughter notified of transport. Pt on NRB at [place].

## 2019-07-17 NOTE — Progress Notes (Signed)
Speech Pathology: Pt's condition deteriorating. SLP service will respectfully sign off.  Anzlee Hinesley L. Tivis Ringer, [place] Office number [phone] Pager [phone]

## 2019-08-02 DEATH — deceased

## 2019-08-05 DIAGNOSIS — Z515 Encounter for palliative care: Secondary | ICD-10-CM

## 2019-08-05 DIAGNOSIS — U071 COVID-19: Secondary | ICD-10-CM

## 2021-04-12 IMAGING — DX DG CHEST 1V PORT
1 series · 1 of 1 positions shown · non-contrast
Comparison: 07/10/2019

CLINICAL DATA: Hypoxia

EXAM:
PORTABLE CHEST 1 VIEW

[chest]
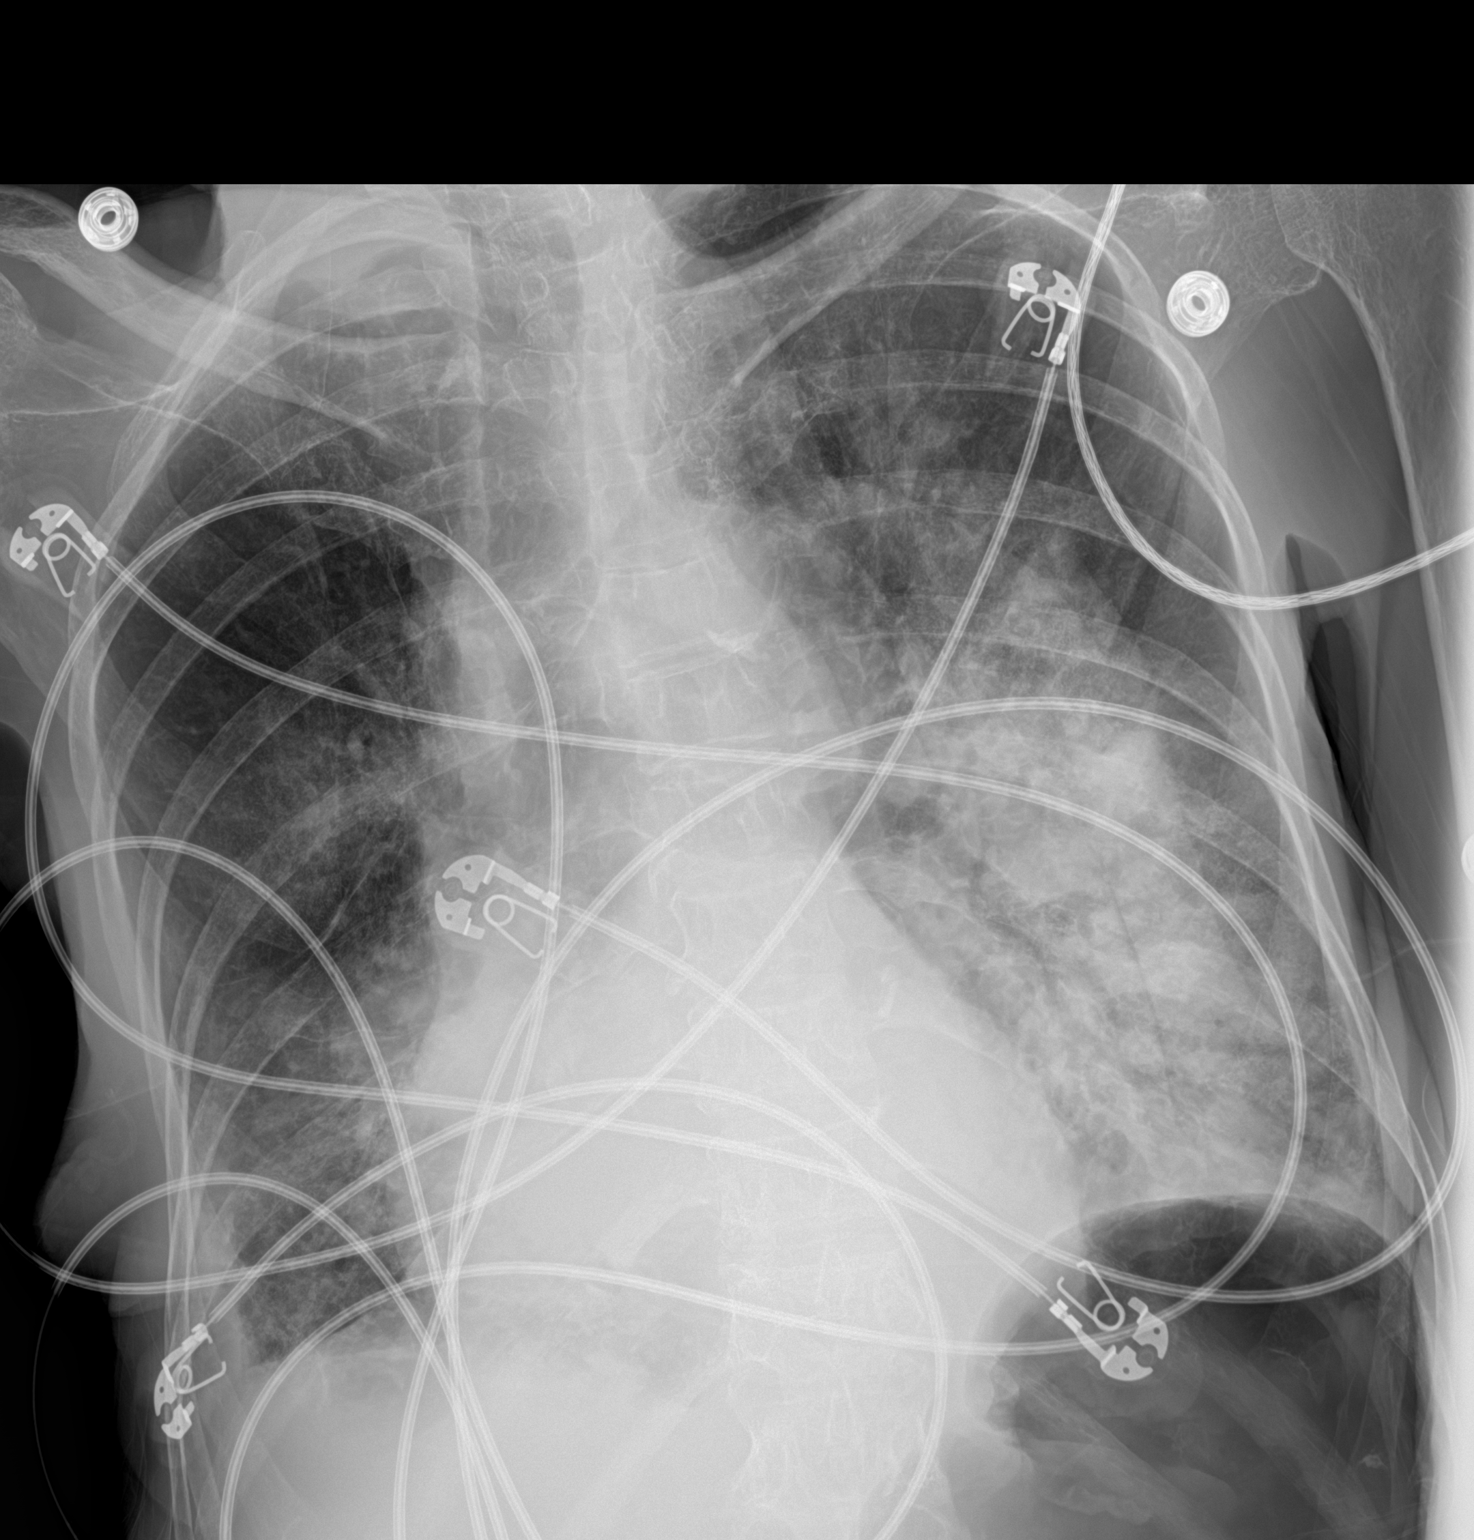

[1 of 1 positions shown; findings below may reference images not displayed]

FINDINGS: Normal cardiac silhouette. Dense airspace disease in the LEFT lower
lobe similar to comparison exam. There is biapical pleural
thickening which is more prominent. Perihilar airspace disease in
the RIGHT is stable.
IMPRESSION: 1. Overall no interval change.
2. Dense lower lobe airspace disease and RIGHT perihilar airspace
disease.
3. Upper lobe apical thickening is more prominent than prior.
